# Patient Record
Sex: Female | Born: 1955 | Race: White | Hispanic: No | Marital: Married | Smoking: Former smoker
Health system: Southern US, Community
[De-identification: ages and names within clinical notes are randomized; demographics above are authoritative.]

## PROBLEM LIST (undated history)

## (undated) DIAGNOSIS — K635 Polyp of colon: Secondary | ICD-10-CM

## (undated) DIAGNOSIS — E049 Nontoxic goiter, unspecified: Secondary | ICD-10-CM

## (undated) HISTORY — DX: Nontoxic goiter, unspecified: E04.9

## (undated) HISTORY — DX: Polyp of colon: K63.5

---

## 2002-11-30 DIAGNOSIS — K635 Polyp of colon: Secondary | ICD-10-CM

## 2002-11-30 HISTORY — DX: Polyp of colon: K63.5

## 2005-10-11 ENCOUNTER — Inpatient Hospital Stay (HOSPITAL_COMMUNITY): Admission: EM | Admit: 2005-10-11 | Discharge: 2005-10-17 | Payer: Self-pay | Admitting: Emergency Medicine

## 2005-10-11 ENCOUNTER — Ambulatory Visit: Payer: Self-pay | Admitting: Infectious Diseases

## 2005-10-14 ENCOUNTER — Encounter (INDEPENDENT_AMBULATORY_CARE_PROVIDER_SITE_OTHER): Payer: Self-pay | Admitting: *Deleted

## 2005-10-15 ENCOUNTER — Ambulatory Visit: Payer: Self-pay | Admitting: Internal Medicine

## 2005-10-21 ENCOUNTER — Ambulatory Visit: Payer: Self-pay | Admitting: Internal Medicine

## 2005-12-08 ENCOUNTER — Ambulatory Visit (HOSPITAL_COMMUNITY): Admission: RE | Admit: 2005-12-08 | Discharge: 2005-12-08 | Payer: Self-pay | Admitting: Gastroenterology

## 2006-10-13 ENCOUNTER — Ambulatory Visit: Payer: Self-pay | Admitting: Unknown Physician Specialty

## 2007-02-24 ENCOUNTER — Observation Stay (HOSPITAL_COMMUNITY): Admission: AD | Admit: 2007-02-24 | Discharge: 2007-02-25 | Payer: Self-pay | Admitting: Gastroenterology

## 2008-06-29 ENCOUNTER — Encounter: Admission: RE | Admit: 2008-06-29 | Discharge: 2008-06-29 | Payer: Self-pay | Admitting: Gastroenterology

## 2011-04-17 NOTE — Op Note (Signed)
[name], [name]                 ACCOUNT NO.:  [id number]   MEDICAL RECORD NO.:  [id number]          PATIENT TYPE:  INP   LOCATION:  6742                         FACILITY:  [facility]   PHYSICIAN:  Clovis Pu. Cornett, M.D.DATE OF BIRTH:  [birth year]   DATE OF PROCEDURE:  10/14/2005  DATE OF DISCHARGE:                                 OPERATIVE REPORT   PREOPERATIVE DIAGNOSIS:  Right lower quadrant pain.   POSTOP DIAGNOSES:  1.  A significant right retroperitoneal inflammatory change.  2.  Normal-appearing appendix.  3.  Normal-appearing gallbladder with distension, small bowel, sigmoid      colon, transverse, ascending colon appeared normal with normal-appearing      stomach and liver. Normal appearing right ovary with fibroids and      uterus, left ovary not visualized.   PROCEDURE:  1.  Diagnostic laparoscopy.  2.  Incidental appendectomy.   SURGEON:  Dr. Harriette Bouillon.   ANESTHESIA:  General endotracheal anesthesia with 10 mL of 0.25% Sensorcaine  with epinephrine.   ESTIMATED BLOOD LOSS:  10 mL.   ANESTHESIA:  General endotracheal anesthesia.   INDICATIONS FOR PROCEDURE:  The patient is a 55 year old female who recently  has been treated for pyelonephritis. She was readmitted due to sudden right  mid and right lower quadrant abdominal pain. I was asked to see the patient  for evaluation and she had tenderness in her right mid to right lower  quadrant on evaluation. She did not have any significant guarding or rebound  but I was concerned that she may have appendicitis. Of note, she has been  recently treated for pyelonephritis but her urine had cleared and concern  was for appendicitis at this point. After discussion of options, I  recommended a diagnostic laparoscopy since her previous CT scan did not  reveal any evidence of appendicitis nor any abscess per se. There was some  fluid in her gallbladder on CT as well so I wanted to look at her  gallbladder at the same time  to make sure she did not have acute  cholecystitis. She had no evidence of stones by CT.  Also her symptoms did  not appear to be colic in nature.   DESCRIPTION OF PROCEDURE:  The patient was brought to the operating room and  placed supine. After induction of general endotracheal anesthesia, her  abdomen prepped and draped in sterile fashion. A 1 cm supraumbilical  incision was made. Dissection was carried to her fascia and her fascia was  opened with a scalpel blade. I placed a Kelly clamp into the peritoneal  cavity without difficulty.  0 Vicryl pursestring suture was placed in the  fascia and a 12 mm Hasson cannula was placed in direct vision.  Pneumoperitoneum was created to 15 mm millimeter mercury CO2 and laparoscope  was placed. She was then placed in steep Trendelenburg. Next, I placed two 5  mm ports, one in the midline between the pubic symphysis and umbilicus under  direct vision. A second port was placed in left mid abdomen.  Upon  inspection there was some clear fluid  in her pelvis but no signs of pus.  This was not bile-stained. I went ahead and examined her uterus, which was  quite small and fibrotic which appeared to be secondary to fibroids. I was  able to visualize her right ovary and fallopian tube and this appeared  normal. I was unable to visualize the left ovary due to the sigmoid colon  and its attachments. There is no obvious inflammatory change in the pelvis  that I could see. I then rolled the patient over to her left. The appendix  was visualized and photographed. This appeared grossly normal. Of note,  there was some fullness I was palpating with a grasper which could be a  possible appendicolith that no inflammatory change was noted.  I examined  her gallbladder which appeared injected but the gallbladder wall itself was  robin egg blue, was not thickened. Of note, there was retroperitoneal  inflammatory change noted in the right upper quadrant when I pulled  the  gallbladder up which would be more consistent with her history of  pyelonephritis. There was not an obvious abscess that I feel this  inflammatory change is what made the gallbladder appear injected. There was  no thickening of the gallbladder wall and the thickening noted on CT was  more likely secondary to intraperitoneal fluid.  Her ascending, transverse  and descending and sigmoid colon appeared grossly normal.  Her small bowel  appeared grossly normal. Liver, stomach appeared normal. Did not visualize  her spleen but it was not enlarged, but the area was not prominent. This  point, after examining, after reviewing the patient's CT and history in my  head, I felt that incidental appendectomy would be appropriate since she is  tender over this area and this would help to not cloud this issue. Using the  harmonic scalpel, I took down the mesoappendix under direct vision until I  encountered the base of the appendix. Once I did this, I fired a Health visitor with a blue cartridge across the base of the appendix and then  removed the appendix with an EndoCatch bag. Once this was done, I reexamined  the base and I used electrocautery to stop a little bit of oozing from the  staple line. I also used a second application of the harmonic scalpel to the  mesoappendix for good hemostasis as well there. There are no signs of  bleeding after irrigation. This area was irrigated and this was suctioned  out.  Next, I placed the patient in reverse Trendelenburg. I placed a 5 mm  port in the subxiphoid position and used a grasper to elevate up the  gallbladder. It was tense, but the gallbladder itself  did not show any  signs of acute cholecystitis. Of note again was the inflammatory stranding  in the retroperitoneum in the right upper quadrant which you can see  involving the right colon at the hepatic flexure but the colon itself appeared uninvolved.  I feel this is secondary to her history of   pyelonephritis and this was visualized on her CT scan as well. This more  than likely would explain her pain at this point.  At this point, I did not  elect to remove the gallbladder at this point since there is no obvious  signs of acute inflammatory change and felt this could be explained by the  retroperitoneal stranding that I saw today. At this point, I removed her  ports and direct vision and released the CO2  without any signs of port site  bleeding. There is no evidence of bowel injury prior to removing the ports.  Once I did this, the camera was withdrawn and the umbilical port was  removed. CO2 was released. I then closed the fascia at the umbilicus with a  0 Vicryl.  4-0 Monocryl was used to close all skin incisions. All sponge,  needle and instrument counts were counted and found to be correct at this  portion of the case. All sponge, needle, instrument counts counted and found  to be correct this portion of the case. The patient was awoke, taken to  recovery in satisfactory condition.      Thomas A. Cornett, M.D.  Electronically Signed     TAC/MEDQ  D:  10/14/2005  T:  10/14/2005  Job:  [id number]   cc:   Fransisco Hertz, M.D.  Fax: [phone]

## 2011-04-17 NOTE — Op Note (Signed)
[name], HEIDEMAN                 ACCOUNT NO.:  [id number]   MEDICAL RECORD NO.:  [id number]          PATIENT TYPE:  AMB   LOCATION:  ENDO                         FACILITY:  [facility]   PHYSICIAN:  James L. Malon Kindle., M.D.DATE OF BIRTH:  20-May-1956   DATE OF PROCEDURE:  12/08/2005  DATE OF DISCHARGE:                                 OPERATIVE REPORT   PROCEDURE PERFORMED:  Esophagogastroduodenoscopy.   MEDICATIONS:  Fentanyl 75 mcg, Versed 7 mg IV.   SCOPE:  Olympus adult endoscope.   ENDOSCOPIST:  Llana Aliment. Randa Evens, M.D.   INDICATIONS FOR PROCEDURE:  Interesting woman who was hospitalized in  November with right-sided abdominal pain.  CT scan showed what appeared to  be peritonitis in the right upper quadrant.  The patient was treated with  antibiotics, got better, had a laparoscopy that showed a normal gallbladder  with no obvious cause of the peritonitis.  __________ was taken and was  normal.  She has been treated with Avelox for a month.  This procedure is  done to look for some sort of cause such as a chronic ulceration, mass,  etc., that may explain the peritonitis.   DESCRIPTION OF PROCEDURE:  The procedure had been explained to the patient  and consent obtained.  With the patient in left lateral decubitus position,  the Olympus scope was inserted and advanced.  The esophagus was completely  endoscopically normal, no ulcerations __________, no varices.  The stomach  was entered.  The pylorus identified and passed.  The duodenum including the  bulb and second portion were seen quite well and were completely  endoscopically normal.  No ulcerations, no diverticula.  Top of the ampulla  appeared normal.  No masses seen.  There were no sign of chronic ulcer  disease such as scarring, distorted bulb, duodenal bulb was widely patent  when insufflated with air.  The stomach was examined and was completely  normal on forward and retroflex view.  The scope was withdrawn.  The  patient  tolerated the procedure well.   ASSESSMENT:  1.  Abnormal CT scan, peritonitis demonstrated on CT and laparoscopy without      obvious cause.   PLAN:  Will follow the patient back up in three to four months.  Will have  her follow with her doctor, __________.           ______________________________  Llana Aliment. Malon Kindle., M.D.     Waldron Session  D:  12/08/2005  T:  12/08/2005  Job:  [id number]   cc:   Julieanne Manson  Fax: [phone]   Maisie Fus A. Cornett, M.D.  [address]  [place] [address]   Fransisco Hertz, M.D.  Fax: [phone]

## 2011-04-17 NOTE — H&P (Signed)
NAMECATELIN, [name]                 ACCOUNT NO.:  [id number]   MEDICAL RECORD NO.:  [id number]          PATIENT TYPE:  INP   LOCATION:  5705                         FACILITY:  [facility]   PHYSICIAN:  James L. Malon Kindle., M.D.DATE OF BIRTH:  [birth year]   DATE OF ADMISSION:  02/24/2007  DATE OF DISCHARGE:                              HISTORY & PHYSICAL   REASON FOR ADMISSION:  Abdominal pain.   HISTORY:  The patient is a nice, 55 year old, white female who comes in  today with acute abdominal pain.  She had an interesting story in that  she was admitted to the hospital in 2006.  At that time, she had severe  right-sided abdominal pain and ended up going to the emergency room at  [facility].  She had been treated for dysuria with antibiotics  at that time and the feeling was that she might have had pyelonephritis.  The patient had an extensive workup then, was doing given IV Zosyn, and  got better.  She never apparently got well and a diagnostic laparoscopy  was performed, which demonstrated significant retroperitoneal  inflammation.  The cecum and terminal ileum were normal.  The  gallbladder was normal.  The patient did have an appendectomy at that  time by Dr. Luisa Hart.  A small bowel series did not show any acute  abnormalities.  The cause of her acute retroperitoneal inflammation was  unclear and she was treated with antibiotics.  Again, she did have  marked improvement and was discharged home on a month of antibiotics  with followup by ID.  She got better.  We saw her and the issue of some  sort of problem with the duodenum such as duodenal diverticulitis or  something of this nature had been brought up.  It was recommended that  she have an endoscopy which was performed in January 2007, and it was  completely normal.  The patient has done well since then.  She receives  her medical care in [place].  We have not seen her back since then .  She notes that since 2006, she  has been completely asymptomatic and has  not had any problems.  She sees Dr. Julieanne Manson there, has seen Dr.  Mechele Collin, has had previous colon polyps removed.  It is notable that she  had no symptoms at all since 2006.  She had a colonoscopy done as  routine followup in November 2007, that was noted to be normal.  She was  told there were no abnormalities.  She again started having dysuria, saw  her urologist in [place], exactly as she had the last time this  happened.  She had some blood in the urine and apparently was given  antibiotics with 4 tablets.  This was about 2 weeks ago.  She took 4  tablets of Levaquin, and her symptoms resolved completely.  Her dysuria  and blood in the urine went away.  She had a urine culture done but the  urine culture returned negative.  She then had an IVP done on March  20th, which she was  called and told it was negative by her urologist.  She has been totally asymptomatic for week.  No dysuria, abdominal pain.  This morning she woke up with abdominal pain which started on the right  side.  It has become more diffuse, and now it is periumbilical.  It has  her doubled over.  She initially ate but this made things worse, so she  has not had anything to eat for a while.  She has not had a bowel  movement today.  No diarrhea associated with this.  She has had some  chills.  Pain has gotten progressively worse according to her husband  over the course of the day with the patient getting to the point where  she could hardly stand up and she came back as an urgent work-in this  afternoon.   CURRENT MEDICATIONS:  1. Cozaar 50 mg daily.  2. Desipramine 25 mg q.h.s.  3. Coreg 3.125 b.i.d.  4. Levothroid 0.100, alternating with 0.112 mg daily.  5. Protonix 40 mg daily.  6. Various vitamins, specifically C, zinc, multivitamin, and Tums      daily.   ALLERGIES:  1. SULFA.  2. CODEINE.  3. MORPHINE CAUSES HALLUCINATIONS.   MEDICAL HISTORY:  1.  History of kidney stones.  2. Urinary tract infections.  3. History of mitral valve prolapse with chest pain, has been on a      beta-blocker for some time.  4. History of hypertension.  5. Chronic anxiety disorder.  6. Hypothyroidism.  7. History of colon polyps with a recent colonoscopy negative in      November.   SURGERIES:  1. Appendectomy and diagnostic laparoscopy by Dr. Luisa Hart in November      2006.  2. Tonsillectomy in the 1970s.   FAMILY HISTORY:  Remarkable for colon cancer in her mother, skin cancer  in father, two children are healthy.   SOCIAL HISTORY:  She is married, is a homemaker, does not smoke, drinks  socially.   REVIEW OF SYSTEMS:  Primarily as noted above.  She has had no signs of  rectal bleeding, melena, tarry stools, etc.   PHYSICAL EXAMINATION:  VITAL SIGNS:  Temperature 97.1, pulse 100, blood  pressure 130/90, weight 141.  GENERAL:  A pleasant white female who was doubled over the exam table,  cannot straighten up.  EYES:  Sclerae nonicteric.  Extraocular movements intact.  NECK:  Supple.  No lymphadenopathy.  No masses.  ABDOMEN:  Diffusely tender.  There is some guarding but no rebound  tenderness.  This is diffuse, but moderate.  I do not appreciate  localized rigidity.  RECTAL:  Stool is brown, heme-negative.   ASSESSMENT:  1. Severe abdominal pain of unclear cause.  This woman has had a      history in the past of peritonitis with the cause undetermined.  It      began just like this with what was felt to be a kidney infection,      but actually turned out to be retroperitoneal peritonitis of      unknown cause.  This presentation seems to be very similar.  And I      think admitting her to the hospital for pain control and a CT scan      to rule out further peritonitis would be appropriate.  I have      discussed this with she and her husband.  They are agreeable.   PLAN: 1. We will admit to the  hospital.  We will keep her on all of her  oral      medications at this point with a sip of water.  2. We will give her Dilaudid for pain since she is intolerant of      morphine.  3. We will obtain routine labs as well as a CT of the abdomen and      pelvis with oral and IV contrast.           ______________________________  Llana Aliment. Malon Kindle., M.D.     Waldron Session  D:  02/24/2007  T:  02/24/2007  Job:  [id number]   cc:   Stephens Shire. Cornett, M.D.  Fransisco Hertz, M.D.

## 2011-04-17 NOTE — Discharge Summary (Signed)
[name], [name]                 ACCOUNT NO.:  [id number]   MEDICAL RECORD NO.:  [id number]          PATIENT TYPE:  INP   LOCATION:  5507                         FACILITY:  [facility]   PHYSICIAN:  Shirley Friar, MDDATE OF BIRTH:  1956/04/27   DATE OF ADMISSION:  02/24/2007  DATE OF DISCHARGE:                               DISCHARGE SUMMARY   DISCHARGE DIAGNOSES:  1. Abdominal pain.  2. History of peritonitis in 2006.  3. History of kidney stones.  4. History of mitral valve prolapse.  5. Hypertension.  6. History of chronic anxiety.  7. Hypothyroidism.  8. History of colon polyps with a recent colonoscopy negative in      November.  9. Status post appendectomy and diagnostic laparoscopy in 2006.  10.Status post tonsillectomy.   DISCHARGE MEDICINES:  1. Phenergan 25 mg p.o. b.i.d. as needed.  2. Cozaar 50 mg daily.  3. Desipramine 25 mg q.h.s.  4. Coreg 3.125 mg b.i.d.  5. Levothroid 0.1 alternating with 0.112 mg daily.  6. Protonix 40 mg p.o. daily.   HOSPITAL COURSE:  [name] was seen in the office by Dr. Randa Evens on  February 24, 2007, and due to severe acute abdominal pain for 24 hours in  the setting of her history of retroperitoneal inflammation 2 years ago,  the decision was made to do inpatient evaluation with pain control.  She  was admitted and given a dose of Dilaudid IV, which took away her pain  completely and it did not return.  Her labs on admission revealed a  slight anemia, at 11 hemoglobin.  She had normal liver function tests  and normal amylase, lipase.  Her urinalysis was negative.  A CAT scan  was done on admission which revealed a negative abdominal CT.  She had  two small uterine fibroids measuring, the largest measuring 2.6-cm.  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  Abdominal ultrasound was done which was negative.  Due to  complete resolution of her pain and tolerating a regular diet on February 25, 2007, and a negative CT and ultrasound, the decision was made to  discharge her home on Phenergan.  She also requested to have twice a day  Protonix to use as needed due to history of reflux.   DISCHARGE CONDITION:  Improved.   DISCHARGE DISPOSITION:  To home, stable.   DISCHARGE FOLLOWUP:  1. Follow with Dr. Randa Evens at [facility] in 3-4 weeks.  2. Follow with her regular doctor in 2 weeks.  3. I encouraged her to set up a followup with her gynecologist either      through her regular doctor who she says is family medicine or      through a gynecologist to further evaluate her fibroids and pelvic      varices, although I think that is unrelated to her history of      abdominal pain.  She plans to set that evaluation or her own.      Shirley Friar, MD  Electronically Signed  VCS/MEDQ  D:  02/25/2007  T:  02/25/2007  Job:  [id number]   cc:   Fayrene Fearing L. Malon Kindle., M.D.  Richard Pryor Curia A. Cornett, M.D.

## 2011-04-17 NOTE — Discharge Summary (Signed)
NAMERONNAE, KASER                 ACCOUNT NO.:  [id number]   MEDICAL RECORD NO.:  [id number]          PATIENT TYPE:  INP   LOCATION:  6742                         FACILITY:  [facility]   PHYSICIAN:  C. Ulyess Mort, M.D.DATE OF BIRTH:  1956-05-08   DATE OF ADMISSION:  10/11/2005  DATE OF DISCHARGE:  10/17/2005                                 DISCHARGE SUMMARY   DISCHARGE DIAGNOSES:  1.  Abdominal pain secondary to resumed duodenal diverticulitis with      retroperitoneal inflammation.  2.  Hypertension.  3.  Hypothyroidism.  4.  Anxiety disorder, not otherwise specified.  5.  Status post appendectomy, October 14, 2005.   DISCHARGE MEDICATIONS:  1.  Avelox 400 mg one tablet daily for four weeks.  2.  Protonix 40 mg one tablet daily.  3.  Reglan 10 mg one tablet q.6 h. p.r.n. for nausea.  4.  Cozaar 50 mg one tablet daily for blood pressure.  5.  Aspirin 81 mg daily.  6.  Carvedilol 6.25 mg b.i.d.  7.  Synthroid 25 mcg one tablet daily.  8.  Desipramine 25 mg one tablet q.h.s.   CONDITION ON DISCHARGE:  Stable.   CONSULTATIONS:  1.  Gastroenterology.  2.  Surgery.   FOLLOW UP:  1.  The patient will have a follow-up appointment on October 21, 2005 in      the outpatient clinic setting.  2.  The patient was advised to contact Dr. Juanda Chance, gastroenterologist, for      appropriate follow-up appointment.  3.  The patient will follow up with Dr. Luisa Hart, general surgery, in two      weeks from this date of discharge.   PROCEDURES:  1.  Diagnostic laparoscopy and incidental appendectomy.  Date of procedure:      October 14, 2005.  2.  Small bowel follow-through.  Date of procedure:  October 17, 2005.   HISTORY OF PRESENT ILLNESS:  Ms. Orlowski is a 55 year old Caucasian woman who  presented with acute onset of severe right upper quadrant abdominal pain  late Saturday night and arrived at the emergency room early Sunday morning.  The patient stated that the two weeks prior she  had had a history of dysuria  and frequency and had received two courses of Augmentin with resolution of  her symptoms.  The patient stated that her pain was very severe in the right  upper quadrant, as well as right flank area, and she had felt feverish with  some chills.   PHYSICAL EXAMINATION:  VITAL SIGNS:  Temperature 102.3 with a pulse of 114.  Blood pressure was 160/94.  O2 saturation was 99-93% on room air.  GENERAL:  The patient was in significant discomfort and bending over in  significant pain.  Exam was pertinent for tenderness throughout the right  upper quadrant, particularly in the right flank region, but otherwise the  patient's skin was warm and dry.   LABORATORY DATA:  Hemoglobin 12.5, hematocrit 37.6, white blood cell count  9.9, platelets 266.  Sodium 137, potassium 3.7, BUN 14, creatinine 0.6.  Urine pregnancy test  was negative.  Urinalysis showed specific gravity of  1.015.  ALT was 19, AST 21, alkaline phosphatase 48, total bilirubin 0.8.   Imaging:  CT of the abdomen and pelvis on the day of admission showed no  stones or hydronephrosis; however, it indicated a possible right  pyelonephritis with mesenteric stippling.   HOSPITAL COURSE:  1.  ACUTE RIGHT ABDOMINAL PAIN.  Although the patient's admission CT did      indicate evidence of acute pyelonephritis, urinalysis in the emergency      room as well as a repeat urinalysis showed no evidence of infection.      There was negative nitrates or leukocyte esterase.  Unfortunately a      pelvic exam was not performed in the emergency room; however, a bimanual      exam was performed on November 14, which showed no cervical motion      tenderness or other abnormality.  Rectal exam also was unremarkable for      any abnormality with normal rectal tone.  Because urinalyses as well as      cultures were unremarkable, the patient's pain persisted as well as      elevated temperature throughout this admission despite  treatment with IV      Zosyn.  As such, a surgery consult was placed and the patient was seen      on October 13, 2005.  Because of the significant tenderness there was a      question whether or not the patient had an acute abdomen and because of      the normal white blood cell count, as well as temperature spikes, she      was taken for diagnostic laparoscopy to rule out any acute pathology.      This was performed on November 15 and the patient was told that      laparoscopy demonstrated significant retroperitoneal inflammation.  The      terminal ileum and cecum were noted to be normal, but the patient had an      incidental appendectomy.  Throughout the course a repeat CT of the      abdomen and pelvis with contrast was obtained on October 12, 2005 and      reevaluated, given the patient's persistent complaints.  Review of the      abdomen with the radiologist did demonstrate a possible diverticular      lesion in the duodenum.  Repeat abdominal x-ray on November 17 did show      findings suspicious for duodenal perforation.  This __________film was      obtained because of the small bowel follow-through and upper GI series      which were to be performed the following day.  Recommendations were made      for possible EGD; however, surgery indicated that acute present stage      EGD would not be indicated because of the severe nature for possible      perforation and/or worsening of her condition.  As such a small bowel      follow-through was obtained on November 18 which did not show any acute      abnormalities.  However, by this time, the patient's pain had gradually      subsided and this after several days of IV Zosyn therapy.  Because of      the degree of inflammation within the retroperitoneum, it was suggested      that there might  have possibly been a small leak to this diverticulum      within the duodenum and thus the inflammation within the peritoneum.     However, the  patient was advised that should her pain recur, that she      was to follow back up immediately.  More than likely she will need a      follow-up CT in 3-4 weeks to evaluate for any evidence of an abscess, as      well as a follow-up appointment in gastroenterology within approximately      three weeks as well.  She was advised to continue her Protonix as well      as p.r.n. Reglan therapy.  The patient was also given a prescription for      Avelox after discussion with Dr. Maurice March regarding appropriate coverage.      This was to be continued for approximately four weeks.  As such the      patient was discharged in stable condition with no further evidence of      abdominal pain.   1.  HYPOTHYROIDISM.  The patient's repeat TSH and T4 were unremarkable and      she was continued on her standard dose of Synthroid.   1.  HYPERTENSION.  The patient was continued on her Cozaar and Carvedilol      therapy and throughout the course of her admission, was not adjusted      because the patient was under a significant amount of pain and      discomfort.  However, these medications were continued and she responded      appropriately.   1.  NORMOCYTIC ANEMIA.  During this hospital admission the patient's      hemoglobin and hematocrit were monitored closely and there was a slight      decrease to her hemoglobin and hematocrit from admission to the time of      her discharge.  She started with a hemoglobin of 12.5, hematocrit 36.6,      with an MCV of 87, and this had dropped to approximately 9.6 and 27.2,      hemoglobin and hematocrit respectively at the time of discharge.  This      was felt to be related to her acute process and did not feel like this      was significant.  In addition the patient did not have any acute      bleeding and as Hemoccult testing was negative, felt that this was      strictly related to her acute process.   The patient was discharged in stable condition with follow-up  instructions  as dictated previously.      Coralie Carpen, M.D.    ______________________________  C. Ulyess Mort, M.D.    FR/MEDQ  D:  10/17/2005  T:  10/18/2005  Job:  [id number]   cc:   C. Ulyess Mort, M.D.  Fax: [phone]   Clovis Pu. Cornett, M.D.  [address]  [place] [address]   Lina Sar, M.D. [facility]  [address]  [place]  [place] [address]

## 2011-07-15 ENCOUNTER — Ambulatory Visit: Payer: Self-pay | Admitting: General Practice

## 2011-11-11 ENCOUNTER — Other Ambulatory Visit: Payer: Self-pay | Admitting: Gastroenterology

## 2011-11-11 DIAGNOSIS — R1011 Right upper quadrant pain: Secondary | ICD-10-CM

## 2011-11-13 ENCOUNTER — Ambulatory Visit
Admission: RE | Admit: 2011-11-13 | Discharge: 2011-11-13 | Disposition: A | Discharge status: Home / Self Care | Payer: BC Managed Care – PPO | Source: Ambulatory Visit | Attending: Gastroenterology | Admitting: Gastroenterology

## 2011-11-13 DIAGNOSIS — R1011 Right upper quadrant pain: Secondary | ICD-10-CM

## 2012-07-05 ENCOUNTER — Ambulatory Visit: Payer: Self-pay | Admitting: Podiatry

## 2013-04-26 ENCOUNTER — Telehealth: Payer: Self-pay | Admitting: *Deleted

## 2013-04-26 NOTE — Telephone Encounter (Signed)
Pt called stating that she needs to schedule a genetic appt due to a strong family hx of several types of cancer.  Confirmed 05/08/13 genetic appt w/ pt.  Called Dr. Chestine Spore Preston's office at [phone] and requested records.

## 2013-05-08 ENCOUNTER — Ambulatory Visit (HOSPITAL_BASED_OUTPATIENT_CLINIC_OR_DEPARTMENT_OTHER): Payer: BC Managed Care – PPO | Admitting: Genetic Counselor

## 2013-05-08 ENCOUNTER — Encounter: Payer: Self-pay | Admitting: Genetic Counselor

## 2013-05-08 ENCOUNTER — Other Ambulatory Visit: Payer: BC Managed Care – PPO | Admitting: Lab

## 2013-05-08 DIAGNOSIS — Z8052 Family history of malignant neoplasm of bladder: Secondary | ICD-10-CM

## 2013-05-08 DIAGNOSIS — Z803 Family history of malignant neoplasm of breast: Secondary | ICD-10-CM

## 2013-05-08 DIAGNOSIS — Z800 Family history of malignant neoplasm of digestive organs: Secondary | ICD-10-CM

## 2013-05-08 NOTE — Progress Notes (Signed)
Dr.  Margaretmary Bayley requested a consultation for genetic counseling and risk assessment for [name], a 57 y.o. female, for discussion of her family history of colon, bladder, pancreatic and breast cancer. She presents to clinic today to discuss the possibility of a genetic predisposition to cancer, and to further clarify her risks, as well as her family members' risks for cancer.   HISTORY OF PRESENT ILLNESS: [name] is a 57 y.o. female with no personal history of cancer.  She has had a benign breast biopsy in the past, and has been diagnosed with fibrocystic breast disease.  She has been treated for a goiter for about 15-20 years.  Her first colonoscopy was at 47 and 2 polyps were found.  She has had 1-2 colonoscopies since with no polyps.  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  Past Medical History  Diagnosis Date  . Colon polyps 2004  . Goiter     dx in her 30s    History reviewed. No pertinent past surgical history.  History  Substance Use Topics  . Smoking status: Former Smoker -- 0.50 packs/day for 12.5 years    Types: Cigarettes    Quit date: 05/08/1980  . Smokeless tobacco: Not on file  . Alcohol Use: Yes     Comment: occasional    REPRODUCTIVE HISTORY AND PERSONAL RISK ASSESSMENT FACTORS: Menarche was at age 56.   Menopause in her 30s- 40s Uterus Intact: Yes Ovaries Intact: Yes G2P2A0, first live birth at age 57  She has not previously undergone treatment for infertility.   Never exposed to DES   She is currently using HRT.    FAMILY HISTORY:  We obtained a detailed, 4-generation family history.  Significant diagnoses are listed below: Family History  Problem Relation Age of Onset  . Colon cancer Mother     dx in her 33s  . Kidney cancer Mother 53    slow growing  . Cancer Father     squamous cell cancer in the mouth; smoker and Journalist, newspaper - syphoned gasoline  . Bladder Cancer Brother  28    smoker for 40 years  . Breast cancer Maternal Aunt 49  . COPD Maternal Uncle   . Dementia Maternal Grandmother   . Heart disease Maternal Grandfather   . Pancreatic cancer Cousin 67    smoker  . Breast cancer Paternal Aunt     dx >59 years of age  The patient's only brother is a 43 year smoker and was diagnosed at age 40 with bladder cancer.  Her mother was diagnosed with colon cancer in her 10s, along with approximately 3 polyps at that time.  At 36 she had a CT scan and incidentally found a renal cell carcinoma, which the patient reports that has been present in previous CT scans, and therefore is slow growing.  Her mother had 11 siblings.  One had breast cancer at 32, and one sister had a son who was a long time smoker and developed pancreatic cancer at age 40.  There is no other reported cancer on her maternal side.  The patient's father was diagnosed with oral squamous cell carcinoma and died at age 27.  By report he was an Journalist, newspaper and used to syphon gas orally and was a long time smoker.  He had three sisters, one of whom had post menopausal breast cancer.  There is no other reported cancer on  her father side of the family.  Patient's maternal ancestors are of [place] descent, and paternal ancestors are of [place] descent. There is no reported Ashkenazi Jewish ancestry. There is no  known consanguinity.  GENETIC COUNSELING ASSESSMENT: [name] is a 57 y.o. female with a personal history of colon polyps and family history of breast, colon, pancreatic and bladder cancer. We, therefore, discussed and recommended the following at today's visit.   DISCUSSION: We reviewed the characteristics, features and inheritance patterns of hereditary cancer syndromes. Based on Kyle Martinezlopez's family history, she does not meet the medical criteria for doing genetic testing for hereditary colon cancer based on NCCN guidelines or BCBS policy.  Ms. Hohn has one maternal aunt who had breast  cancer at age 11, however, there are a lot of women on her maternal side with nobody else with breast or ovarian cancer.  Therefore the chance that there is a hereditary colon or breast cancer running in the family is low.  PLAN: After considering the risks, benefits, and limitations, Dalexa Gentz decided not to pursue genetic testing. Lydia Toren will receive a summary of her genetic counseling visit. This information will also be available in Epic. We encouraged Isys Tietje to remain in contact with cancer genetics annually so that we can continuously update the family history and inform her of any changes in cancer genetics and testing that may be of benefit for her family. Ritaj Iribe's questions were answered to her satisfaction today. Our contact information was provided should additional questions or concerns arise.  The patient was seen for a total of 50 minutes, greater than 50% of which was spent face-to-face counseling.  This plan is being carried out per Dr. Feliz Beam recommendations.  This note will also be sent to the referring provider via the electronic medical record. The patient will be supplied with a summary of this genetic counseling discussion as well as educational information on the discussed hereditary cancer syndromes following the conclusion of their visit.   Patient was discussed with Dr. Drue Second.   _______________________________________________________________________ For Office Staff:  Number of people involved in session: 2 Was an Intern/ student involved with case: no

## 2013-07-21 DIAGNOSIS — I341 Nonrheumatic mitral (valve) prolapse: Secondary | ICD-10-CM | POA: Insufficient documentation

## 2013-07-21 DIAGNOSIS — K631 Perforation of intestine (nontraumatic): Secondary | ICD-10-CM | POA: Insufficient documentation

## 2013-07-21 DIAGNOSIS — I340 Nonrheumatic mitral (valve) insufficiency: Secondary | ICD-10-CM | POA: Insufficient documentation

## 2013-07-21 DIAGNOSIS — I10 Essential (primary) hypertension: Secondary | ICD-10-CM | POA: Insufficient documentation

## 2013-07-21 DIAGNOSIS — E039 Hypothyroidism, unspecified: Secondary | ICD-10-CM | POA: Insufficient documentation

## 2013-08-04 ENCOUNTER — Encounter: Payer: Self-pay | Admitting: Internal Medicine

## 2014-02-06 ENCOUNTER — Ambulatory Visit: Payer: Self-pay | Admitting: Podiatry

## 2015-05-21 ENCOUNTER — Other Ambulatory Visit: Payer: Self-pay | Admitting: Family Medicine

## 2015-05-21 ENCOUNTER — Ambulatory Visit
Admission: RE | Admit: 2015-05-21 | Discharge: 2015-05-21 | Disposition: A | Discharge status: Home / Self Care | Payer: BLUE CROSS/BLUE SHIELD | Source: Ambulatory Visit | Attending: Family Medicine | Admitting: Family Medicine

## 2015-05-21 DIAGNOSIS — R102 Pelvic and perineal pain: Secondary | ICD-10-CM

## 2015-05-21 DIAGNOSIS — D259 Leiomyoma of uterus, unspecified: Secondary | ICD-10-CM | POA: Insufficient documentation

## 2015-05-21 DIAGNOSIS — I878 Other specified disorders of veins: Secondary | ICD-10-CM | POA: Diagnosis not present

## 2015-10-29 ENCOUNTER — Other Ambulatory Visit: Payer: Self-pay | Admitting: Gastroenterology

## 2015-12-21 IMAGING — US US TRANSVAGINAL NON-OB
1 series · 13 of 25 positions shown · non-contrast
Comparison: CT Abdomen and Pelvis 06/29/2008.

CLINICAL DATA: 59-year-old female with right lower quadrant and
pelvic pain for 2 days. No vaginal bleeding. Initial encounter.



[Series 1: us transvaginal non-ob · 0.20mm/px · 13 of 90 slices shown]
[im 1/90]
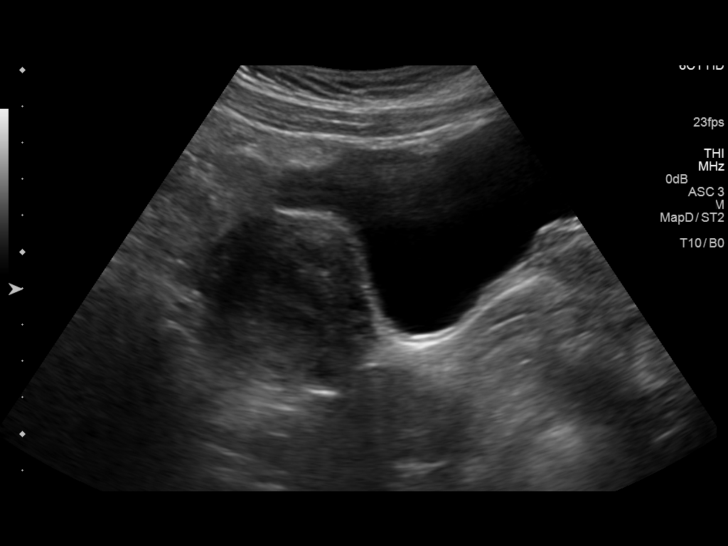
[im 8/90]
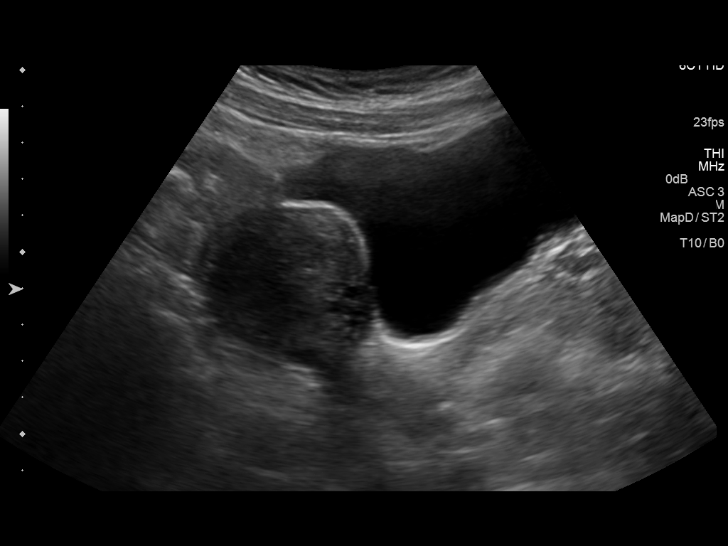
[im 15/90]
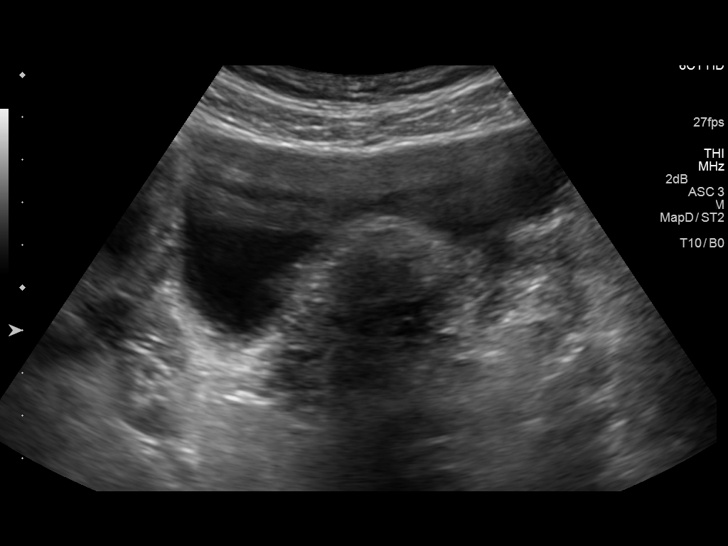
[im 23/90]
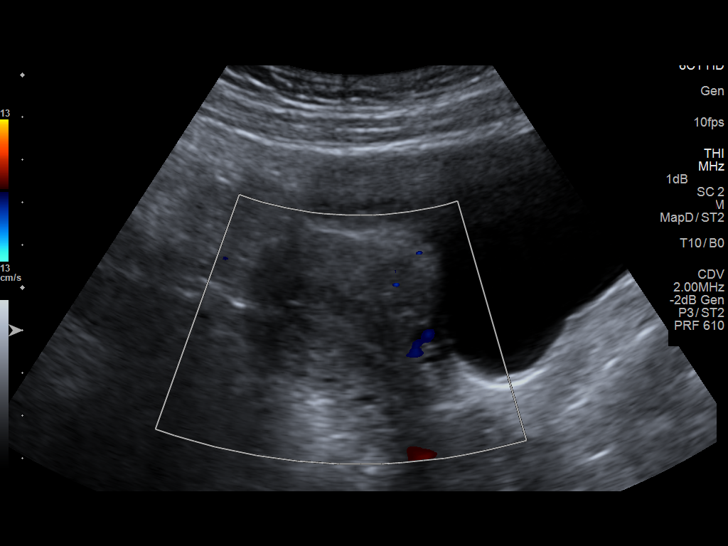
[im 30/90]
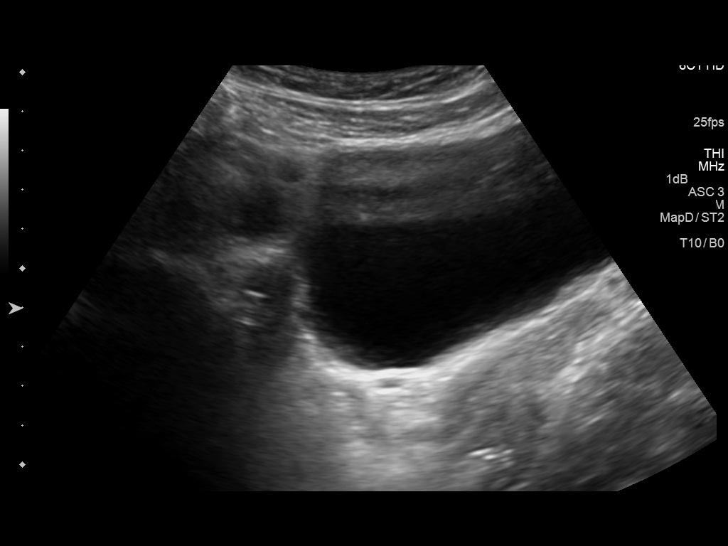
[im 38/90]
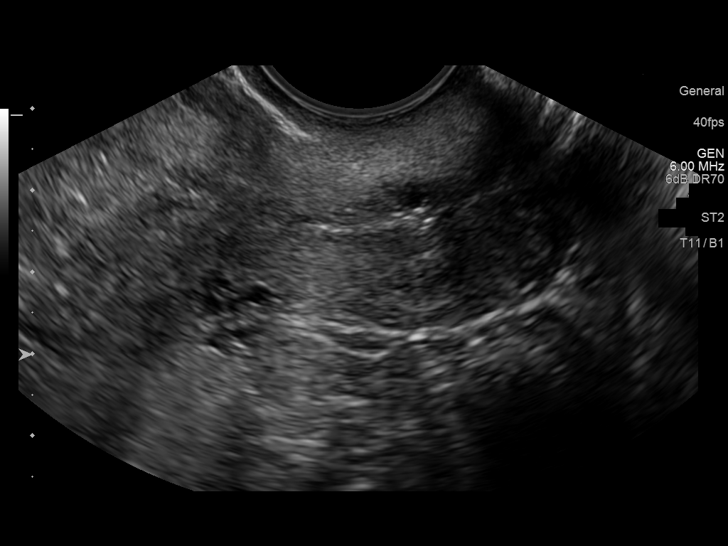
[im 45/90]
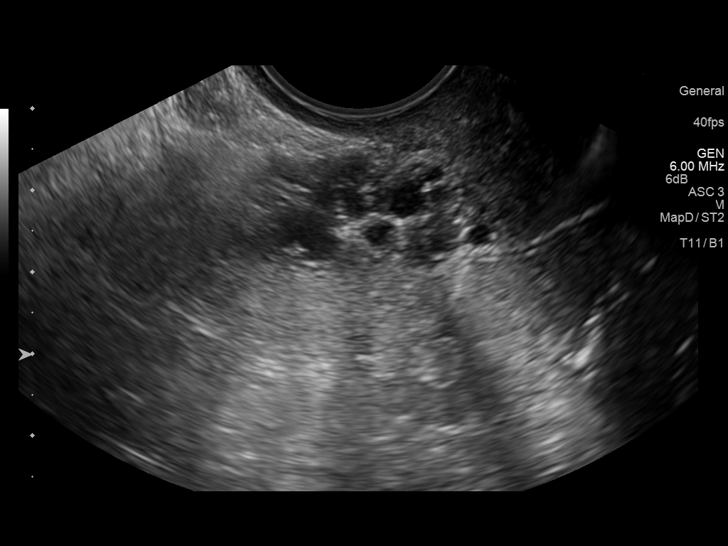
[im 52/90]
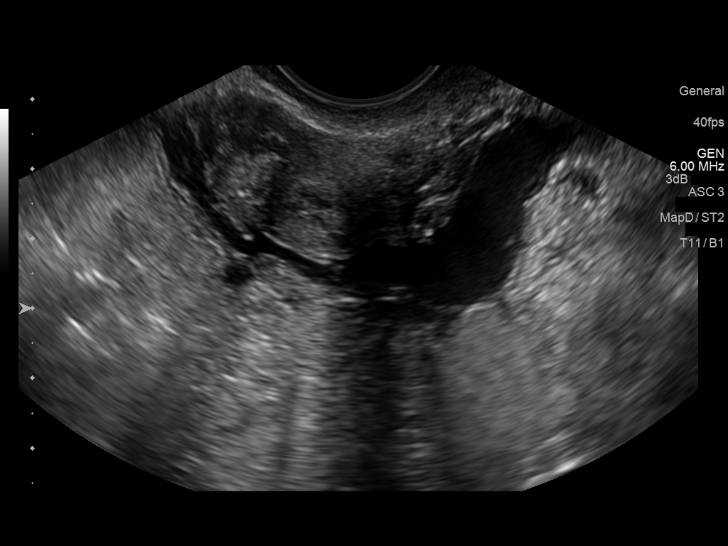
[im 60/90]
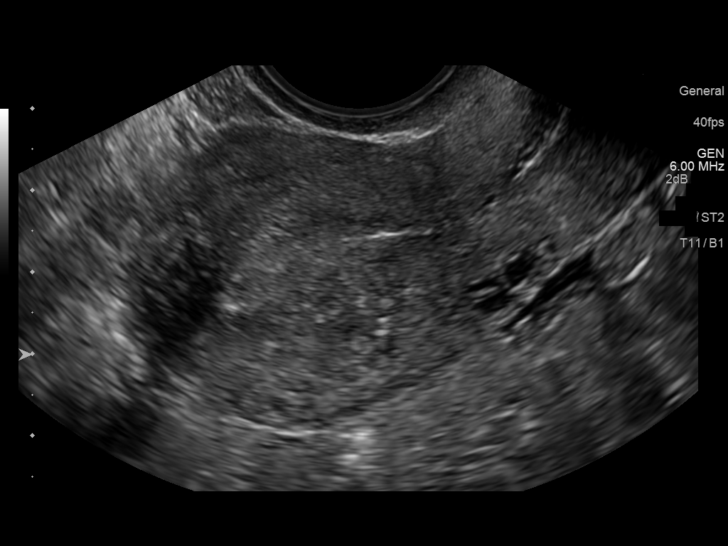
[im 67/90]
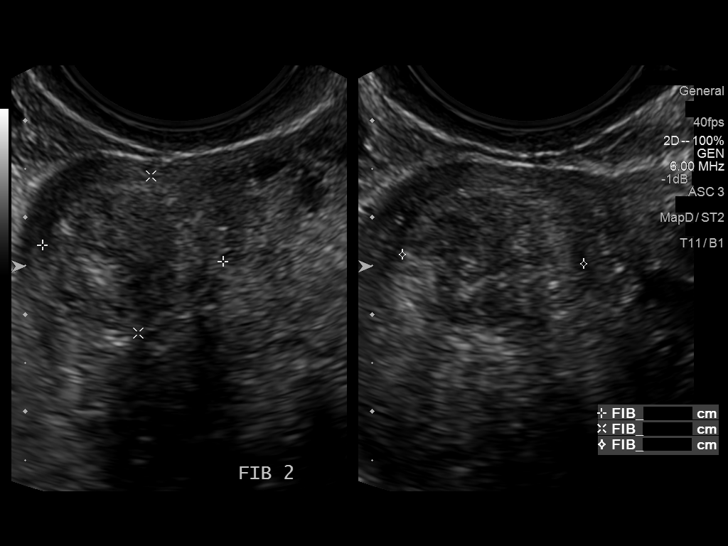
[im 75/90]
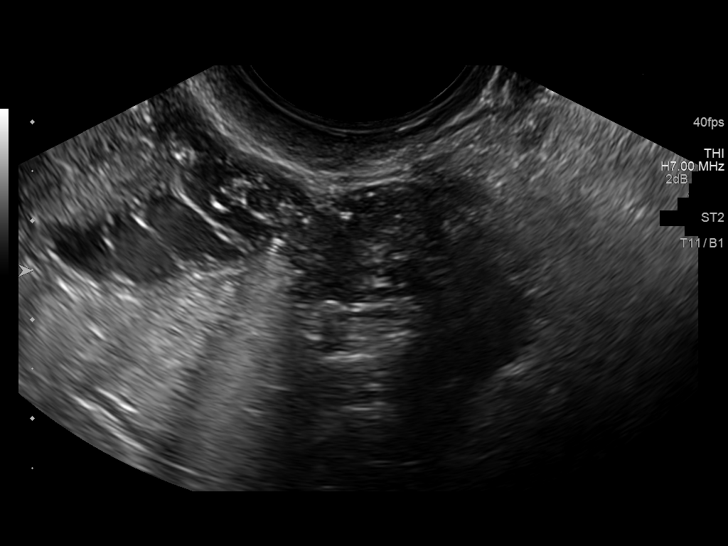
[im 82/90]
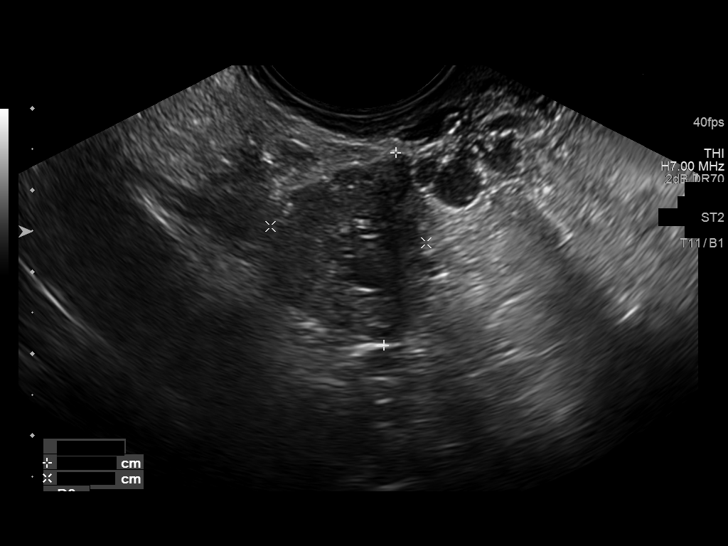
[im 90/90]
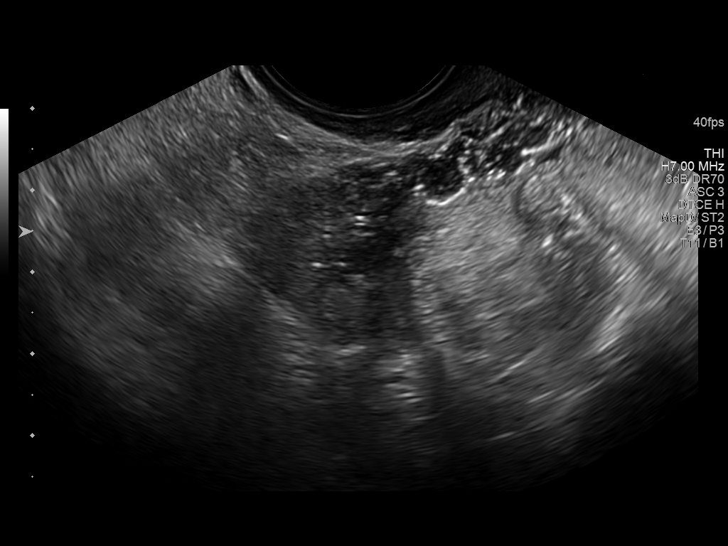

[13 of 25 positions shown; findings below may reference images not displayed]

FINDINGS: Uterus

Measurements: 8.6 x 6.4 x 4.7 cm. Chronic intramural fibroids, the
largest is 3.2 cm diameter at the posterior fundus (approximately
2.7 cm in 5555). There is a 1.9 cm fibroid at the anterior fundus.

Endometrium

Thickness: 8 mm.  No focal abnormality visualized.

Right ovary

Measurements: 2.4 x 1.9 x 2.0 cm. Normal appearance/no adnexal mass.

Left ovary

Measurements: 2.4 x 1.5 x 1.8 cm. Normal appearance/no adnexal mass.

Other findings

No pelvic free fluid identified. Chronic enlargement of distal left
gonadal/ parametrial veins (image 54 today, series 3, image 74 on
the prior study).
IMPRESSION: 1. Chronic fibroid uterus, mild progression since 5555 with the
largest fibroid now 3.2 cm.
2. Chronic enlarged pelvic/distal gonadal veins on the left.
Consider pelvic congestion syndrome.
These results will be called to the ordering clinician or
representative by the [HOSPITAL] at the imaging location.

## 2017-07-22 DIAGNOSIS — G4700 Insomnia, unspecified: Secondary | ICD-10-CM | POA: Insufficient documentation

## 2018-08-23 DIAGNOSIS — R911 Solitary pulmonary nodule: Secondary | ICD-10-CM | POA: Insufficient documentation

## 2021-03-08 DIAGNOSIS — K5100 Ulcerative (chronic) pancolitis without complications: Secondary | ICD-10-CM | POA: Insufficient documentation

## 2021-03-31 DIAGNOSIS — Z85528 Personal history of other malignant neoplasm of kidney: Secondary | ICD-10-CM | POA: Insufficient documentation

## 2021-07-31 ENCOUNTER — Ambulatory Visit (INDEPENDENT_AMBULATORY_CARE_PROVIDER_SITE_OTHER): Payer: Medicare HMO | Admitting: Dermatology

## 2021-07-31 ENCOUNTER — Other Ambulatory Visit: Payer: Self-pay

## 2021-07-31 DIAGNOSIS — L821 Other seborrheic keratosis: Secondary | ICD-10-CM

## 2021-07-31 DIAGNOSIS — L650 Telogen effluvium: Secondary | ICD-10-CM

## 2021-07-31 NOTE — Patient Instructions (Addendum)
If you have any questions or concerns for your doctor, please call our main line at [phone] and press option 4 to reach your doctor's medical assistant. If no one answers, please leave a voicemail as directed and we will return your call as soon as possible. Messages left after 4 pm will be answered the following business day.   You may also send [place] a message via [place]. We typically respond to MyChart messages within 1-2 business days.  For prescription refills, please ask your pharmacy to contact our office. Our fax number is [phone].  If you have an urgent issue when the clinic is closed that cannot wait until the next business day, you can page your doctor at the number below.    Please note that while we do our best to be available for urgent issues outside of office hours, we are not available 24/7.   If you have an urgent issue and are unable to reach [place], you may choose to seek medical care at your doctor's office, retail clinic, urgent care center, or emergency room.  If you have a medical emergency, please immediately call 911 or go to the emergency department.  Pager Numbers  - Dr. Nehemiah Massed: [phone]  - Dr. Laurence Ferrari: [phone]  - Dr. Nicole Kindred: [phone]  In the event of inclement weather, please call our main line at [phone] for an update on the status of any delays or closures.  Dermatology Medication Tips: Please keep the boxes that topical medications come in in order to help keep track of the instructions about where and how to use these. Pharmacies typically print the medication instructions only on the boxes and not directly on the medication tubes.   If your medication is too expensive, please contact our office at [phone] option 4 or send [place] a message through [place].   We are unable to tell what your co-pay for medications will be in advance as this is different depending on your insurance coverage. However, we may be able to find a substitute  medication at lower cost or fill out paperwork to get insurance to cover a needed medication.   If a prior authorization is required to get your medication covered by your insurance company, please allow [place] 1-2 business days to complete this process.  Drug prices often vary depending on where the prescription is filled and some pharmacies may offer cheaper prices.  The website www.goodrx.com contains coupons for medications through different pharmacies. The prices here do not account for what the cost may be with help from insurance (it may be cheaper with your insurance), but the website can give you the price if you did not use any insurance.  - You can print the associated coupon and take it with your prescription to the pharmacy.  - You may also stop by our office during regular business hours and pick up a GoodRx coupon card.  - If you need your prescription sent electronically to a different pharmacy, notify our office through [facility] or by phone at [phone] option 4.  Treatment for Telogen Effluvium Biotin 2.'5mg'$  daily Rogaine 5% 2 times a day Red light hair treatment, Hair Max, Capillus, Revian    Seborrheic Keratosis  What causes seborrheic keratoses? Seborrheic keratoses are harmless, common skin growths that first appear during adult life.  As time goes by, more growths appear.  Some people may develop a large number of them.  Seborrheic keratoses appear on both covered and uncovered body parts.  They  are not caused by sunlight.  The tendency to develop seborrheic keratoses can be inherited.  They vary in color from skin-colored to gray, brown, or even black.  They can be either smooth or have a rough, warty surface.   Seborrheic keratoses are superficial and look as if they were stuck on the skin.  Under the microscope this type of keratosis looks like layers upon layers of skin.  That is why at times the top layer may seem to fall off, but the rest of the growth  remains and re-grows.    Treatment Seborrheic keratoses do not need to be treated, but can easily be removed in the office.  Seborrheic keratoses often cause symptoms when they rub on clothing or jewelry.  Lesions can be in the way of shaving.  If they become inflamed, they can cause itching, soreness, or burning.  Removal of a seborrheic keratosis can be accomplished by freezing, burning, or surgery. If any spot bleeds, scabs, or grows rapidly, please return to have it checked, as these can be an indication of a skin cancer.

## 2021-07-31 NOTE — Progress Notes (Signed)
   New Patient Visit  Subjective  [name] is a 65 y.o. female who presents for the following: Alopecia (Scalp, 6-8 wks worsened over 6-8wks  diagnosed with ulcerative colitis 03/22, started Entyvio infusion 05/22, partial nephrectomy 6wks ago pt on Biotin,) and check spot (R cheek, yrs, no changes).  The following portions of the chart were reviewed this encounter and updated as appropriate:   Allergies  Meds  Problems  Med Hx  Surg Hx  Fam Hx     Review of Systems:  No other skin or systemic complaints except as noted in HPI or Assessment and Plan.  Objective  Well appearing patient in no apparent distress; mood and affect are within normal limits.  A focused examination was performed including scalp. Relevant physical exam findings are noted in the Assessment and Plan.  Scalp Generalized thinning, temporal area with new hairs  R cheek Stuck-on, waxy, tan-brown papules and plaques -- Discussed benign etiology and prognosis.    Assessment & Plan   Seborrheic Keratoses - Stuck-on, waxy, tan-brown papules and/or plaques  - Benign-appearing - Discussed benign etiology and prognosis. - Observe - Call for any changes  Telogen effluvium Scalp  R/t physical stress/illness and mental stress - recent diagnosis of Ulcerative colitis 03/22, recent partial nephrectomy 6 wks ago,    Telogen effluvium is a benign, self limited condition causing increased hair shedding usually for several months. It does not progress to baldness. It can be triggered by recent illness, recent surgery, thyroid disease, low iron stores, vitamin D deficiency, fad diets or rapid weight loss, hormonal changes such as pregnancy or birth control pills, and some medication. Usually the hair loss starts 2-3 months after the illness or health change. Rarely, it can continue for longer than a year.  Discussed may not get 100% of hair back but should improve in time.  Article on Telogen Effluvium given to  pt.  Cont Biotin 2.'5mg'$  qd Recomend starting Rogaine 5% bid to scalp Recommend red light hair treatment, Hair max, Capillus, Revian  Seborrheic keratosis R cheek Benign, observe.    Return if symptoms worsen or fail to improve.  I, Othelia Pulling, RMA, am acting as scribe for Sarina Ser, MD . Documentation: I have reviewed the above documentation for accuracy and completeness, and I agree with the above.  Sarina Ser, MD

## 2021-08-06 ENCOUNTER — Encounter: Payer: Self-pay | Admitting: Dermatology

## 2021-10-10 DIAGNOSIS — Z79899 Other long term (current) drug therapy: Secondary | ICD-10-CM | POA: Insufficient documentation

## 2021-11-12 DIAGNOSIS — M2391 Unspecified internal derangement of right knee: Secondary | ICD-10-CM | POA: Insufficient documentation

## 2021-12-18 DIAGNOSIS — Z8052 Family history of malignant neoplasm of bladder: Secondary | ICD-10-CM | POA: Diagnosis not present

## 2021-12-18 DIAGNOSIS — Z79899 Other long term (current) drug therapy: Secondary | ICD-10-CM | POA: Diagnosis not present

## 2021-12-18 DIAGNOSIS — R918 Other nonspecific abnormal finding of lung field: Secondary | ICD-10-CM | POA: Diagnosis not present

## 2021-12-18 DIAGNOSIS — R0602 Shortness of breath: Secondary | ICD-10-CM | POA: Diagnosis not present

## 2021-12-18 DIAGNOSIS — R0789 Other chest pain: Secondary | ICD-10-CM | POA: Diagnosis not present

## 2021-12-18 DIAGNOSIS — J479 Bronchiectasis, uncomplicated: Secondary | ICD-10-CM | POA: Diagnosis not present

## 2021-12-18 DIAGNOSIS — Z87891 Personal history of nicotine dependence: Secondary | ICD-10-CM | POA: Diagnosis not present

## 2021-12-18 DIAGNOSIS — Z800 Family history of malignant neoplasm of digestive organs: Secondary | ICD-10-CM | POA: Diagnosis not present

## 2021-12-23 DIAGNOSIS — A319 Mycobacterial infection, unspecified: Secondary | ICD-10-CM | POA: Diagnosis not present

## 2021-12-23 DIAGNOSIS — D649 Anemia, unspecified: Secondary | ICD-10-CM | POA: Diagnosis not present

## 2021-12-23 DIAGNOSIS — F419 Anxiety disorder, unspecified: Secondary | ICD-10-CM | POA: Diagnosis not present

## 2021-12-23 DIAGNOSIS — K5100 Ulcerative (chronic) pancolitis without complications: Secondary | ICD-10-CM | POA: Diagnosis not present

## 2021-12-29 DIAGNOSIS — E042 Nontoxic multinodular goiter: Secondary | ICD-10-CM | POA: Insufficient documentation

## 2021-12-29 DIAGNOSIS — Z905 Acquired absence of kidney: Secondary | ICD-10-CM | POA: Insufficient documentation

## 2021-12-30 DIAGNOSIS — Z79899 Other long term (current) drug therapy: Secondary | ICD-10-CM | POA: Diagnosis not present

## 2021-12-30 DIAGNOSIS — K52831 Collagenous colitis: Secondary | ICD-10-CM | POA: Diagnosis not present

## 2021-12-30 DIAGNOSIS — K5100 Ulcerative (chronic) pancolitis without complications: Secondary | ICD-10-CM | POA: Diagnosis not present

## 2021-12-31 DIAGNOSIS — K5100 Ulcerative (chronic) pancolitis without complications: Secondary | ICD-10-CM | POA: Diagnosis not present

## 2021-12-31 DIAGNOSIS — K52831 Collagenous colitis: Secondary | ICD-10-CM | POA: Diagnosis not present

## 2021-12-31 DIAGNOSIS — Z79899 Other long term (current) drug therapy: Secondary | ICD-10-CM | POA: Diagnosis not present

## 2022-01-01 DIAGNOSIS — Z800 Family history of malignant neoplasm of digestive organs: Secondary | ICD-10-CM | POA: Insufficient documentation

## 2022-01-01 DIAGNOSIS — K219 Gastro-esophageal reflux disease without esophagitis: Secondary | ICD-10-CM | POA: Insufficient documentation

## 2022-01-01 DIAGNOSIS — C641 Malignant neoplasm of right kidney, except renal pelvis: Secondary | ICD-10-CM | POA: Diagnosis not present

## 2022-01-01 DIAGNOSIS — Z860101 Personal history of adenomatous and serrated colon polyps: Secondary | ICD-10-CM | POA: Insufficient documentation

## 2022-01-01 DIAGNOSIS — I10 Essential (primary) hypertension: Secondary | ICD-10-CM | POA: Diagnosis not present

## 2022-01-01 DIAGNOSIS — M1712 Unilateral primary osteoarthritis, left knee: Secondary | ICD-10-CM | POA: Diagnosis not present

## 2022-01-01 DIAGNOSIS — I341 Nonrheumatic mitral (valve) prolapse: Secondary | ICD-10-CM | POA: Diagnosis not present

## 2022-01-20 DIAGNOSIS — E039 Hypothyroidism, unspecified: Secondary | ICD-10-CM | POA: Diagnosis not present

## 2022-01-21 DIAGNOSIS — K5100 Ulcerative (chronic) pancolitis without complications: Secondary | ICD-10-CM | POA: Diagnosis not present

## 2022-01-21 DIAGNOSIS — R197 Diarrhea, unspecified: Secondary | ICD-10-CM | POA: Diagnosis not present

## 2022-01-21 DIAGNOSIS — K6389 Other specified diseases of intestine: Secondary | ICD-10-CM | POA: Diagnosis not present

## 2022-01-21 DIAGNOSIS — K5130 Ulcerative (chronic) rectosigmoiditis without complications: Secondary | ICD-10-CM | POA: Diagnosis not present

## 2022-01-21 DIAGNOSIS — I341 Nonrheumatic mitral (valve) prolapse: Secondary | ICD-10-CM | POA: Diagnosis not present

## 2022-01-21 DIAGNOSIS — K5190 Ulcerative colitis, unspecified, without complications: Secondary | ICD-10-CM | POA: Diagnosis not present

## 2022-01-21 DIAGNOSIS — K6289 Other specified diseases of anus and rectum: Secondary | ICD-10-CM | POA: Diagnosis not present

## 2022-01-21 DIAGNOSIS — Z885 Allergy status to narcotic agent status: Secondary | ICD-10-CM | POA: Diagnosis not present

## 2022-01-21 DIAGNOSIS — K529 Noninfective gastroenteritis and colitis, unspecified: Secondary | ICD-10-CM | POA: Diagnosis not present

## 2022-01-21 DIAGNOSIS — T380X5A Adverse effect of glucocorticoids and synthetic analogues, initial encounter: Secondary | ICD-10-CM | POA: Diagnosis not present

## 2022-01-21 DIAGNOSIS — K580 Irritable bowel syndrome with diarrhea: Secondary | ICD-10-CM | POA: Diagnosis not present

## 2022-01-21 DIAGNOSIS — Z79620 Long term (current) use of immunosuppressive biologic: Secondary | ICD-10-CM | POA: Diagnosis not present

## 2022-01-21 DIAGNOSIS — N189 Chronic kidney disease, unspecified: Secondary | ICD-10-CM | POA: Diagnosis not present

## 2022-01-21 DIAGNOSIS — D500 Iron deficiency anemia secondary to blood loss (chronic): Secondary | ICD-10-CM | POA: Diagnosis not present

## 2022-01-21 DIAGNOSIS — Z882 Allergy status to sulfonamides status: Secondary | ICD-10-CM | POA: Diagnosis not present

## 2022-01-21 DIAGNOSIS — Z888 Allergy status to other drugs, medicaments and biological substances status: Secondary | ICD-10-CM | POA: Diagnosis not present

## 2022-01-21 DIAGNOSIS — I129 Hypertensive chronic kidney disease with stage 1 through stage 4 chronic kidney disease, or unspecified chronic kidney disease: Secondary | ICD-10-CM | POA: Diagnosis not present

## 2022-01-21 DIAGNOSIS — I10 Essential (primary) hypertension: Secondary | ICD-10-CM | POA: Diagnosis not present

## 2022-01-21 DIAGNOSIS — Z87891 Personal history of nicotine dependence: Secondary | ICD-10-CM | POA: Diagnosis not present

## 2022-01-21 DIAGNOSIS — E039 Hypothyroidism, unspecified: Secondary | ICD-10-CM | POA: Diagnosis not present

## 2022-01-21 DIAGNOSIS — K219 Gastro-esophageal reflux disease without esophagitis: Secondary | ICD-10-CM | POA: Diagnosis not present

## 2022-01-21 DIAGNOSIS — K631 Perforation of intestine (nontraumatic): Secondary | ICD-10-CM | POA: Diagnosis not present

## 2022-01-21 DIAGNOSIS — K52831 Collagenous colitis: Secondary | ICD-10-CM | POA: Diagnosis not present

## 2022-01-21 DIAGNOSIS — Z79899 Other long term (current) drug therapy: Secondary | ICD-10-CM | POA: Diagnosis not present

## 2022-01-21 DIAGNOSIS — K648 Other hemorrhoids: Secondary | ICD-10-CM | POA: Diagnosis not present

## 2022-01-21 DIAGNOSIS — Z7952 Long term (current) use of systemic steroids: Secondary | ICD-10-CM | POA: Diagnosis not present

## 2022-01-30 DIAGNOSIS — K5100 Ulcerative (chronic) pancolitis without complications: Secondary | ICD-10-CM | POA: Diagnosis not present

## 2022-02-13 DIAGNOSIS — Z79899 Other long term (current) drug therapy: Secondary | ICD-10-CM | POA: Diagnosis not present

## 2022-02-13 DIAGNOSIS — D500 Iron deficiency anemia secondary to blood loss (chronic): Secondary | ICD-10-CM | POA: Diagnosis not present

## 2022-02-13 DIAGNOSIS — K5100 Ulcerative (chronic) pancolitis without complications: Secondary | ICD-10-CM | POA: Diagnosis not present

## 2022-02-13 DIAGNOSIS — K219 Gastro-esophageal reflux disease without esophagitis: Secondary | ICD-10-CM | POA: Diagnosis not present

## 2022-02-16 DIAGNOSIS — K5100 Ulcerative (chronic) pancolitis without complications: Secondary | ICD-10-CM | POA: Diagnosis not present

## 2022-02-19 DIAGNOSIS — M1712 Unilateral primary osteoarthritis, left knee: Secondary | ICD-10-CM | POA: Diagnosis not present

## 2022-02-19 DIAGNOSIS — M25562 Pain in left knee: Secondary | ICD-10-CM | POA: Diagnosis not present

## 2022-02-19 DIAGNOSIS — K5100 Ulcerative (chronic) pancolitis without complications: Secondary | ICD-10-CM | POA: Diagnosis not present

## 2022-02-25 DIAGNOSIS — D472 Monoclonal gammopathy: Secondary | ICD-10-CM | POA: Diagnosis not present

## 2022-02-25 DIAGNOSIS — Z87891 Personal history of nicotine dependence: Secondary | ICD-10-CM | POA: Diagnosis not present

## 2022-02-25 DIAGNOSIS — Z9049 Acquired absence of other specified parts of digestive tract: Secondary | ICD-10-CM | POA: Diagnosis not present

## 2022-02-25 DIAGNOSIS — Z905 Acquired absence of kidney: Secondary | ICD-10-CM | POA: Diagnosis not present

## 2022-02-25 DIAGNOSIS — Z79899 Other long term (current) drug therapy: Secondary | ICD-10-CM | POA: Diagnosis not present

## 2022-02-25 DIAGNOSIS — D649 Anemia, unspecified: Secondary | ICD-10-CM | POA: Diagnosis not present

## 2022-03-06 DIAGNOSIS — Z79899 Other long term (current) drug therapy: Secondary | ICD-10-CM | POA: Diagnosis not present

## 2022-03-06 DIAGNOSIS — Z86010 Personal history of colonic polyps: Secondary | ICD-10-CM | POA: Diagnosis not present

## 2022-03-06 DIAGNOSIS — K5100 Ulcerative (chronic) pancolitis without complications: Secondary | ICD-10-CM | POA: Diagnosis not present

## 2022-03-10 DIAGNOSIS — G8929 Other chronic pain: Secondary | ICD-10-CM | POA: Diagnosis not present

## 2022-03-10 DIAGNOSIS — M25562 Pain in left knee: Secondary | ICD-10-CM | POA: Diagnosis not present

## 2022-03-16 DIAGNOSIS — K5100 Ulcerative (chronic) pancolitis without complications: Secondary | ICD-10-CM | POA: Diagnosis not present

## 2022-03-24 DIAGNOSIS — R911 Solitary pulmonary nodule: Secondary | ICD-10-CM | POA: Diagnosis not present

## 2022-03-24 DIAGNOSIS — G8929 Other chronic pain: Secondary | ICD-10-CM | POA: Diagnosis not present

## 2022-03-24 DIAGNOSIS — M25562 Pain in left knee: Secondary | ICD-10-CM | POA: Diagnosis not present

## 2022-03-24 DIAGNOSIS — J479 Bronchiectasis, uncomplicated: Secondary | ICD-10-CM | POA: Diagnosis not present

## 2022-03-31 DIAGNOSIS — N952 Postmenopausal atrophic vaginitis: Secondary | ICD-10-CM | POA: Diagnosis not present

## 2022-03-31 DIAGNOSIS — Z124 Encounter for screening for malignant neoplasm of cervix: Secondary | ICD-10-CM | POA: Diagnosis not present

## 2022-03-31 DIAGNOSIS — Z1231 Encounter for screening mammogram for malignant neoplasm of breast: Secondary | ICD-10-CM | POA: Diagnosis not present

## 2022-03-31 DIAGNOSIS — Z79890 Hormone replacement therapy (postmenopausal): Secondary | ICD-10-CM | POA: Diagnosis not present

## 2022-04-07 DIAGNOSIS — E039 Hypothyroidism, unspecified: Secondary | ICD-10-CM | POA: Diagnosis not present

## 2022-04-09 DIAGNOSIS — R911 Solitary pulmonary nodule: Secondary | ICD-10-CM | POA: Diagnosis not present

## 2022-04-09 DIAGNOSIS — J479 Bronchiectasis, uncomplicated: Secondary | ICD-10-CM | POA: Diagnosis not present

## 2022-04-09 DIAGNOSIS — R0602 Shortness of breath: Secondary | ICD-10-CM | POA: Diagnosis not present

## 2022-04-14 DIAGNOSIS — G8929 Other chronic pain: Secondary | ICD-10-CM | POA: Diagnosis not present

## 2022-04-14 DIAGNOSIS — M25562 Pain in left knee: Secondary | ICD-10-CM | POA: Diagnosis not present

## 2022-04-15 DIAGNOSIS — E042 Nontoxic multinodular goiter: Secondary | ICD-10-CM | POA: Diagnosis not present

## 2022-04-15 DIAGNOSIS — Z905 Acquired absence of kidney: Secondary | ICD-10-CM | POA: Diagnosis not present

## 2022-04-15 DIAGNOSIS — K529 Noninfective gastroenteritis and colitis, unspecified: Secondary | ICD-10-CM | POA: Diagnosis not present

## 2022-04-15 DIAGNOSIS — E039 Hypothyroidism, unspecified: Secondary | ICD-10-CM | POA: Diagnosis not present

## 2022-04-20 DIAGNOSIS — C641 Malignant neoplasm of right kidney, except renal pelvis: Secondary | ICD-10-CM | POA: Diagnosis not present

## 2022-04-20 DIAGNOSIS — K5100 Ulcerative (chronic) pancolitis without complications: Secondary | ICD-10-CM | POA: Diagnosis not present

## 2022-04-20 DIAGNOSIS — E039 Hypothyroidism, unspecified: Secondary | ICD-10-CM | POA: Diagnosis not present

## 2022-04-20 DIAGNOSIS — K631 Perforation of intestine (nontraumatic): Secondary | ICD-10-CM | POA: Diagnosis not present

## 2022-04-24 DIAGNOSIS — Z79890 Hormone replacement therapy (postmenopausal): Secondary | ICD-10-CM | POA: Diagnosis not present

## 2022-04-24 DIAGNOSIS — J209 Acute bronchitis, unspecified: Secondary | ICD-10-CM | POA: Diagnosis not present

## 2022-04-24 DIAGNOSIS — R918 Other nonspecific abnormal finding of lung field: Secondary | ICD-10-CM | POA: Diagnosis not present

## 2022-04-24 DIAGNOSIS — Z800 Family history of malignant neoplasm of digestive organs: Secondary | ICD-10-CM | POA: Diagnosis not present

## 2022-04-24 DIAGNOSIS — Z86018 Personal history of other benign neoplasm: Secondary | ICD-10-CM | POA: Diagnosis not present

## 2022-04-24 DIAGNOSIS — Z803 Family history of malignant neoplasm of breast: Secondary | ICD-10-CM | POA: Diagnosis not present

## 2022-04-24 DIAGNOSIS — I10 Essential (primary) hypertension: Secondary | ICD-10-CM | POA: Diagnosis not present

## 2022-04-24 DIAGNOSIS — Z905 Acquired absence of kidney: Secondary | ICD-10-CM | POA: Diagnosis not present

## 2022-04-24 DIAGNOSIS — K5100 Ulcerative (chronic) pancolitis without complications: Secondary | ICD-10-CM | POA: Diagnosis not present

## 2022-04-24 DIAGNOSIS — R911 Solitary pulmonary nodule: Secondary | ICD-10-CM | POA: Diagnosis not present

## 2022-04-24 DIAGNOSIS — J8409 Other alveolar and parieto-alveolar conditions: Secondary | ICD-10-CM | POA: Diagnosis not present

## 2022-04-24 DIAGNOSIS — E039 Hypothyroidism, unspecified: Secondary | ICD-10-CM | POA: Diagnosis not present

## 2022-04-24 DIAGNOSIS — D638 Anemia in other chronic diseases classified elsewhere: Secondary | ICD-10-CM | POA: Diagnosis not present

## 2022-04-24 DIAGNOSIS — Z87891 Personal history of nicotine dependence: Secondary | ICD-10-CM | POA: Diagnosis not present

## 2022-04-24 DIAGNOSIS — M1712 Unilateral primary osteoarthritis, left knee: Secondary | ICD-10-CM | POA: Diagnosis not present

## 2022-04-24 DIAGNOSIS — Z8052 Family history of malignant neoplasm of bladder: Secondary | ICD-10-CM | POA: Diagnosis not present

## 2022-04-24 DIAGNOSIS — Z79899 Other long term (current) drug therapy: Secondary | ICD-10-CM | POA: Diagnosis not present

## 2022-05-05 ENCOUNTER — Ambulatory Visit: Payer: Medicare Other | Admitting: Urology

## 2022-05-05 VITALS — BP 134/81 | HR 92 | Ht 66.0 in | Wt 148.0 lb

## 2022-05-05 DIAGNOSIS — Z905 Acquired absence of kidney: Secondary | ICD-10-CM

## 2022-05-05 DIAGNOSIS — Z87440 Personal history of urinary (tract) infections: Secondary | ICD-10-CM | POA: Diagnosis not present

## 2022-05-05 LAB — URINALYSIS, COMPLETE
Bilirubin, UA: NEGATIVE
Glucose, UA: NEGATIVE
Ketones, UA: NEGATIVE
Leukocytes,UA: NEGATIVE
Nitrite, UA: NEGATIVE
Protein,UA: NEGATIVE
RBC, UA: NEGATIVE
Specific Gravity, UA: 1.01 (ref 1.005–1.030)
Urobilinogen, Ur: 0.2 mg/dL (ref 0.2–1.0)
pH, UA: 6 (ref 5.0–7.5)

## 2022-05-05 LAB — MICROSCOPIC EXAMINATION: Bacteria, UA: NONE SEEN

## 2022-05-05 NOTE — Progress Notes (Signed)
05/06/22 8:40 AM   [name] September 18, 1956 [id number]  Referring provider:  Hortencia Pilar, MD [address] [place] [address] [place],  [place] [address] Chief Complaint  Patient presents with   partial nephrectomy     HPI: [name] is a 66 y.o.female who presents today to establish care.   She is s/p partial nephrectomy at [facility] in 05/2021 for a 1.6 cm right upper pole renal mass. 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  No further follow-up was recommended.  Post-op she was treated in 07/2021 for UTI and acute pyelonephritis.   She report that she was diagnosed with ulcerative colitis and had to get that under control in order to be able to have partial nephrectomy. She reports that she feels what feels like scar tissue near her incision site in her right flank area, palpable abnormality but not painful.  She does also have a personal history of recurrent urinary tract infections but infrequent over the past several years.  Her primary impetus for today's visit was to establish care with urologist so that she has a local provider.  Her urologist at [facility] is since left the practice.  PMH: Past Medical History:  Diagnosis Date   Colon polyps 2004   Goiter    dx in her 32s    Surgical History: No past surgical history on file.  Home Medications:  Allergies as of 05/05/2022       Reactions   Codeine Itching, Nausea Only   Mesalamine Nausea Only   Other reaction(s): Abdominal Pain   Sulfa Antibiotics Itching   Nisoldipine Rash        Medication List        Accurate as of May 05, 2022 11:59 PM. If you have any questions, ask your nurse or doctor.          albuterol 108 (90 Base) MCG/ACT inhaler Commonly known as: VENTOLIN HFA Inhale into the lungs.   amoxicillin 500 MG capsule Commonly known as: AMOXIL Take 500 mg by mouth 3 (three) times daily.   carvedilol 12.5 MG tablet Commonly known as: COREG Take 12.5 mg by mouth 2 (two)  times daily.   desipramine 25 MG tablet Commonly known as: NORPRAMIN Take by mouth.   Entyvio 300 MG injection Generic drug: vedolizumab Inject into the vein.   estradiol 0.1 MG/GM vaginal cream Commonly known as: ESTRACE Place vaginally.   ferrous sulfate 325 (65 FE) MG tablet Take 325 mg by mouth 2 (two) times daily.   levothyroxine 112 MCG tablet Commonly known as: SYNTHROID Take 112 mcg by mouth daily.   levothyroxine 125 MCG tablet Commonly known as: SYNTHROID Take 125 mcg by mouth daily.   Multi-Vitamin tablet Take 1 tablet by mouth daily.   omeprazole 20 MG capsule Commonly known as: PRILOSEC Take 20 mg by mouth daily.   omeprazole 40 MG capsule Commonly known as: PRILOSEC Take 40 mg by mouth daily.   predniSONE 10 MG tablet Commonly known as: DELTASONE Take by mouth.        Allergies:  Allergies  Allergen Reactions   Codeine Itching and Nausea Only   Mesalamine Nausea Only    Other reaction(s): Abdominal Pain   Sulfa Antibiotics Itching   Nisoldipine Rash    Family History: Family History  Problem Relation Age of Onset   Colon cancer Mother        dx in her 82s   Kidney cancer Mother 69  slow growing   Cancer Father        squamous cell cancer in the mouth; smoker and [employer] - syphoned gasoline   Bladder Cancer Brother 44       smoker for 40 years   Breast cancer Maternal Aunt 49   COPD Maternal Uncle    Dementia Maternal Grandmother    Heart disease Maternal Grandfather    Pancreatic cancer Cousin 90       smoker   Breast cancer Paternal Aunt        dx >40 years of age    Social History:  reports that she quit smoking about 42 years ago. Her smoking use included cigarettes. She has a 6.25 pack-year smoking history. She does not have any smokeless tobacco history on file. She reports current alcohol use. No history on file for drug use.   Physical Exam: BP 134/81   Pulse 92   Ht '5\' 6"'$  (1.676 m)   Wt 148 lb (67.1 kg)    BMI 23.89 kg/m   Constitutional:  Alert and oriented, No acute distress. HEENT: [name] AT, moist mucus membranes.  Trachea midline, no masses. Cardiovascular: No clubbing, cyanosis, or edema. Respiratory: Normal respiratory effort, no increased work of breathing. GI: Abdominal incisions well-healed.  No palpable abnormality in right flank area, no hernias or defects appreciated. Skin: No rashes, bruises or suspicious lesions. Neurologic: Grossly intact, no focal deficits, moving all 4 extremities. Psychiatric: Normal mood and affect.  Laboratory Data: No results found for: CREATININE No results found for: HGBA1C  Urinalysis Negative   Assessment & Plan:    History of renal mass  - Although pathology was oncocytic tumor, may be reasonable to repeat a renal ultrasound next year for reassurance -Further follow-up thereafter if this is unremarkable  2. History of rUTIs  - She has not had any recent documented infections but based on her history  she wants to be affiliated with urological practces incase she has any issues   1 year with renal ultrasound prior  [name] as a scribe for Kendra Espy, MD.,have documented all relevant documentation on the behalf of Kendra Espy, MD,as directed by  Kendra Espy, MD while in the presence of [name], [place] [address] [place], [place] [address] [phone]

## 2022-05-07 DIAGNOSIS — M1712 Unilateral primary osteoarthritis, left knee: Secondary | ICD-10-CM | POA: Diagnosis not present

## 2022-05-11 DIAGNOSIS — K5100 Ulcerative (chronic) pancolitis without complications: Secondary | ICD-10-CM | POA: Diagnosis not present

## 2022-05-14 DIAGNOSIS — K5100 Ulcerative (chronic) pancolitis without complications: Secondary | ICD-10-CM | POA: Diagnosis not present

## 2022-05-27 DIAGNOSIS — K5100 Ulcerative (chronic) pancolitis without complications: Secondary | ICD-10-CM | POA: Diagnosis not present

## 2022-06-12 DIAGNOSIS — K5100 Ulcerative (chronic) pancolitis without complications: Secondary | ICD-10-CM | POA: Diagnosis not present

## 2022-06-12 DIAGNOSIS — Z79899 Other long term (current) drug therapy: Secondary | ICD-10-CM | POA: Diagnosis not present

## 2022-06-12 DIAGNOSIS — K219 Gastro-esophageal reflux disease without esophagitis: Secondary | ICD-10-CM | POA: Diagnosis not present

## 2022-07-13 DIAGNOSIS — R221 Localized swelling, mass and lump, neck: Secondary | ICD-10-CM | POA: Diagnosis not present

## 2022-07-13 DIAGNOSIS — R600 Localized edema: Secondary | ICD-10-CM | POA: Diagnosis not present

## 2022-07-15 DIAGNOSIS — M2391 Unspecified internal derangement of right knee: Secondary | ICD-10-CM | POA: Diagnosis not present

## 2022-07-15 DIAGNOSIS — M1712 Unilateral primary osteoarthritis, left knee: Secondary | ICD-10-CM | POA: Diagnosis not present

## 2022-07-15 DIAGNOSIS — E039 Hypothyroidism, unspecified: Secondary | ICD-10-CM | POA: Diagnosis not present

## 2022-07-15 DIAGNOSIS — Z79899 Other long term (current) drug therapy: Secondary | ICD-10-CM | POA: Diagnosis not present

## 2022-07-15 DIAGNOSIS — M7631 Iliotibial band syndrome, right leg: Secondary | ICD-10-CM | POA: Diagnosis not present

## 2022-07-21 DIAGNOSIS — M1711 Unilateral primary osteoarthritis, right knee: Secondary | ICD-10-CM | POA: Diagnosis not present

## 2022-07-23 DIAGNOSIS — E042 Nontoxic multinodular goiter: Secondary | ICD-10-CM | POA: Diagnosis not present

## 2022-07-23 DIAGNOSIS — E039 Hypothyroidism, unspecified: Secondary | ICD-10-CM | POA: Diagnosis not present

## 2022-07-23 DIAGNOSIS — Z905 Acquired absence of kidney: Secondary | ICD-10-CM | POA: Diagnosis not present

## 2022-07-24 DIAGNOSIS — Z1231 Encounter for screening mammogram for malignant neoplasm of breast: Secondary | ICD-10-CM | POA: Diagnosis not present

## 2022-07-24 DIAGNOSIS — N6310 Unspecified lump in the right breast, unspecified quadrant: Secondary | ICD-10-CM | POA: Diagnosis not present

## 2022-07-24 DIAGNOSIS — N6320 Unspecified lump in the left breast, unspecified quadrant: Secondary | ICD-10-CM | POA: Diagnosis not present

## 2022-07-28 DIAGNOSIS — K631 Perforation of intestine (nontraumatic): Secondary | ICD-10-CM | POA: Diagnosis not present

## 2022-07-28 DIAGNOSIS — Z01818 Encounter for other preprocedural examination: Secondary | ICD-10-CM | POA: Diagnosis not present

## 2022-07-28 DIAGNOSIS — Z905 Acquired absence of kidney: Secondary | ICD-10-CM | POA: Diagnosis not present

## 2022-07-28 DIAGNOSIS — I340 Nonrheumatic mitral (valve) insufficiency: Secondary | ICD-10-CM | POA: Diagnosis not present

## 2022-07-28 DIAGNOSIS — I10 Essential (primary) hypertension: Secondary | ICD-10-CM | POA: Diagnosis not present

## 2022-07-28 DIAGNOSIS — R911 Solitary pulmonary nodule: Secondary | ICD-10-CM | POA: Diagnosis not present

## 2022-07-28 DIAGNOSIS — M1712 Unilateral primary osteoarthritis, left knee: Secondary | ICD-10-CM | POA: Diagnosis not present

## 2022-07-28 DIAGNOSIS — K219 Gastro-esophageal reflux disease without esophagitis: Secondary | ICD-10-CM | POA: Diagnosis not present

## 2022-07-28 DIAGNOSIS — I341 Nonrheumatic mitral (valve) prolapse: Secondary | ICD-10-CM | POA: Diagnosis not present

## 2022-07-28 DIAGNOSIS — D649 Anemia, unspecified: Secondary | ICD-10-CM | POA: Diagnosis not present

## 2022-07-28 DIAGNOSIS — M25562 Pain in left knee: Secondary | ICD-10-CM | POA: Diagnosis not present

## 2022-07-28 DIAGNOSIS — E042 Nontoxic multinodular goiter: Secondary | ICD-10-CM | POA: Diagnosis not present

## 2022-07-28 DIAGNOSIS — E039 Hypothyroidism, unspecified: Secondary | ICD-10-CM | POA: Diagnosis not present

## 2022-07-28 DIAGNOSIS — C641 Malignant neoplasm of right kidney, except renal pelvis: Secondary | ICD-10-CM | POA: Diagnosis not present

## 2022-07-28 DIAGNOSIS — K580 Irritable bowel syndrome with diarrhea: Secondary | ICD-10-CM | POA: Diagnosis not present

## 2022-07-28 DIAGNOSIS — K5100 Ulcerative (chronic) pancolitis without complications: Secondary | ICD-10-CM | POA: Diagnosis not present

## 2022-09-10 DIAGNOSIS — I10 Essential (primary) hypertension: Secondary | ICD-10-CM | POA: Diagnosis not present

## 2022-09-10 DIAGNOSIS — Z79899 Other long term (current) drug therapy: Secondary | ICD-10-CM | POA: Diagnosis not present

## 2022-09-10 DIAGNOSIS — I341 Nonrheumatic mitral (valve) prolapse: Secondary | ICD-10-CM | POA: Diagnosis not present

## 2022-09-10 DIAGNOSIS — G8929 Other chronic pain: Secondary | ICD-10-CM | POA: Diagnosis not present

## 2022-09-10 DIAGNOSIS — M21162 Varus deformity, not elsewhere classified, left knee: Secondary | ICD-10-CM | POA: Diagnosis not present

## 2022-09-10 DIAGNOSIS — K219 Gastro-esophageal reflux disease without esophagitis: Secondary | ICD-10-CM | POA: Diagnosis not present

## 2022-09-10 DIAGNOSIS — Z87891 Personal history of nicotine dependence: Secondary | ICD-10-CM | POA: Diagnosis not present

## 2022-09-10 DIAGNOSIS — Z793 Long term (current) use of hormonal contraceptives: Secondary | ICD-10-CM | POA: Diagnosis not present

## 2022-09-10 DIAGNOSIS — Z9889 Other specified postprocedural states: Secondary | ICD-10-CM | POA: Diagnosis not present

## 2022-09-10 DIAGNOSIS — D631 Anemia in chronic kidney disease: Secondary | ICD-10-CM | POA: Diagnosis not present

## 2022-09-10 DIAGNOSIS — M1712 Unilateral primary osteoarthritis, left knee: Secondary | ICD-10-CM | POA: Diagnosis not present

## 2022-09-10 DIAGNOSIS — I129 Hypertensive chronic kidney disease with stage 1 through stage 4 chronic kidney disease, or unspecified chronic kidney disease: Secondary | ICD-10-CM | POA: Diagnosis not present

## 2022-09-10 DIAGNOSIS — K3189 Other diseases of stomach and duodenum: Secondary | ICD-10-CM | POA: Diagnosis not present

## 2022-09-10 DIAGNOSIS — N189 Chronic kidney disease, unspecified: Secondary | ICD-10-CM | POA: Diagnosis not present

## 2022-09-10 DIAGNOSIS — K5100 Ulcerative (chronic) pancolitis without complications: Secondary | ICD-10-CM | POA: Diagnosis not present

## 2022-09-10 DIAGNOSIS — M24562 Contracture, left knee: Secondary | ICD-10-CM | POA: Diagnosis not present

## 2022-09-10 DIAGNOSIS — I340 Nonrheumatic mitral (valve) insufficiency: Secondary | ICD-10-CM | POA: Diagnosis not present

## 2022-09-10 DIAGNOSIS — E039 Hypothyroidism, unspecified: Secondary | ICD-10-CM | POA: Diagnosis not present

## 2022-09-10 DIAGNOSIS — Z79890 Hormone replacement therapy (postmenopausal): Secondary | ICD-10-CM | POA: Diagnosis not present

## 2022-09-10 DIAGNOSIS — G8918 Other acute postprocedural pain: Secondary | ICD-10-CM | POA: Diagnosis not present

## 2022-09-16 DIAGNOSIS — Z96659 Presence of unspecified artificial knee joint: Secondary | ICD-10-CM | POA: Diagnosis not present

## 2022-09-16 DIAGNOSIS — M79662 Pain in left lower leg: Secondary | ICD-10-CM | POA: Diagnosis not present

## 2022-09-30 DIAGNOSIS — Z96652 Presence of left artificial knee joint: Secondary | ICD-10-CM | POA: Insufficient documentation

## 2022-10-05 DIAGNOSIS — M25562 Pain in left knee: Secondary | ICD-10-CM | POA: Diagnosis not present

## 2022-10-05 DIAGNOSIS — M25662 Stiffness of left knee, not elsewhere classified: Secondary | ICD-10-CM | POA: Diagnosis not present

## 2022-10-05 DIAGNOSIS — Z96652 Presence of left artificial knee joint: Secondary | ICD-10-CM | POA: Diagnosis not present

## 2022-10-05 DIAGNOSIS — R2681 Unsteadiness on feet: Secondary | ICD-10-CM | POA: Diagnosis not present

## 2022-10-08 DIAGNOSIS — R2681 Unsteadiness on feet: Secondary | ICD-10-CM | POA: Diagnosis not present

## 2022-10-08 DIAGNOSIS — M25562 Pain in left knee: Secondary | ICD-10-CM | POA: Diagnosis not present

## 2022-10-08 DIAGNOSIS — Z96652 Presence of left artificial knee joint: Secondary | ICD-10-CM | POA: Diagnosis not present

## 2022-10-08 DIAGNOSIS — M25662 Stiffness of left knee, not elsewhere classified: Secondary | ICD-10-CM | POA: Diagnosis not present

## 2022-10-12 DIAGNOSIS — Z1331 Encounter for screening for depression: Secondary | ICD-10-CM | POA: Diagnosis not present

## 2022-10-12 DIAGNOSIS — R739 Hyperglycemia, unspecified: Secondary | ICD-10-CM | POA: Insufficient documentation

## 2022-10-12 DIAGNOSIS — I10 Essential (primary) hypertension: Secondary | ICD-10-CM | POA: Diagnosis not present

## 2022-10-12 DIAGNOSIS — Z0000 Encounter for general adult medical examination without abnormal findings: Secondary | ICD-10-CM | POA: Diagnosis not present

## 2022-10-12 DIAGNOSIS — E039 Hypothyroidism, unspecified: Secondary | ICD-10-CM | POA: Diagnosis not present

## 2022-10-13 DIAGNOSIS — M25662 Stiffness of left knee, not elsewhere classified: Secondary | ICD-10-CM | POA: Diagnosis not present

## 2022-10-13 DIAGNOSIS — Z96652 Presence of left artificial knee joint: Secondary | ICD-10-CM | POA: Diagnosis not present

## 2022-10-13 DIAGNOSIS — M25562 Pain in left knee: Secondary | ICD-10-CM | POA: Diagnosis not present

## 2022-10-13 DIAGNOSIS — R2681 Unsteadiness on feet: Secondary | ICD-10-CM | POA: Diagnosis not present

## 2022-10-23 DIAGNOSIS — M25562 Pain in left knee: Secondary | ICD-10-CM | POA: Diagnosis not present

## 2022-10-23 DIAGNOSIS — Z96652 Presence of left artificial knee joint: Secondary | ICD-10-CM | POA: Diagnosis not present

## 2022-10-23 DIAGNOSIS — M25662 Stiffness of left knee, not elsewhere classified: Secondary | ICD-10-CM | POA: Diagnosis not present

## 2022-10-23 DIAGNOSIS — R2681 Unsteadiness on feet: Secondary | ICD-10-CM | POA: Diagnosis not present

## 2022-10-27 DIAGNOSIS — I10 Essential (primary) hypertension: Secondary | ICD-10-CM | POA: Diagnosis not present

## 2022-10-27 DIAGNOSIS — I341 Nonrheumatic mitral (valve) prolapse: Secondary | ICD-10-CM | POA: Diagnosis not present

## 2022-10-29 DIAGNOSIS — R2681 Unsteadiness on feet: Secondary | ICD-10-CM | POA: Diagnosis not present

## 2022-10-29 DIAGNOSIS — M25562 Pain in left knee: Secondary | ICD-10-CM | POA: Diagnosis not present

## 2022-10-29 DIAGNOSIS — M25662 Stiffness of left knee, not elsewhere classified: Secondary | ICD-10-CM | POA: Diagnosis not present

## 2022-10-29 DIAGNOSIS — Z96652 Presence of left artificial knee joint: Secondary | ICD-10-CM | POA: Diagnosis not present

## 2022-11-02 DIAGNOSIS — M25562 Pain in left knee: Secondary | ICD-10-CM | POA: Diagnosis not present

## 2022-11-02 DIAGNOSIS — Z96652 Presence of left artificial knee joint: Secondary | ICD-10-CM | POA: Diagnosis not present

## 2022-11-02 DIAGNOSIS — M25662 Stiffness of left knee, not elsewhere classified: Secondary | ICD-10-CM | POA: Diagnosis not present

## 2022-11-02 DIAGNOSIS — R2681 Unsteadiness on feet: Secondary | ICD-10-CM | POA: Diagnosis not present

## 2022-11-03 DIAGNOSIS — K1121 Acute sialoadenitis: Secondary | ICD-10-CM | POA: Diagnosis not present

## 2022-11-06 DIAGNOSIS — K51918 Ulcerative colitis, unspecified with other complication: Secondary | ICD-10-CM | POA: Diagnosis not present

## 2022-11-06 DIAGNOSIS — K51018 Ulcerative (chronic) pancolitis with other complication: Secondary | ICD-10-CM | POA: Diagnosis not present

## 2022-11-06 DIAGNOSIS — Z79899 Other long term (current) drug therapy: Secondary | ICD-10-CM | POA: Diagnosis not present

## 2022-11-08 DIAGNOSIS — K5190 Ulcerative colitis, unspecified, without complications: Secondary | ICD-10-CM | POA: Insufficient documentation

## 2022-11-09 DIAGNOSIS — Z87891 Personal history of nicotine dependence: Secondary | ICD-10-CM | POA: Diagnosis not present

## 2022-11-09 DIAGNOSIS — R0602 Shortness of breath: Secondary | ICD-10-CM | POA: Diagnosis not present

## 2022-11-09 DIAGNOSIS — R948 Abnormal results of function studies of other organs and systems: Secondary | ICD-10-CM | POA: Diagnosis not present

## 2022-11-09 DIAGNOSIS — R918 Other nonspecific abnormal finding of lung field: Secondary | ICD-10-CM | POA: Diagnosis not present

## 2022-11-09 DIAGNOSIS — R0789 Other chest pain: Secondary | ICD-10-CM | POA: Diagnosis not present

## 2022-11-09 DIAGNOSIS — Z7969 Long term (current) use of other immunomodulators and immunosuppressants: Secondary | ICD-10-CM | POA: Diagnosis not present

## 2022-11-09 DIAGNOSIS — R197 Diarrhea, unspecified: Secondary | ICD-10-CM | POA: Diagnosis not present

## 2022-11-09 DIAGNOSIS — M542 Cervicalgia: Secondary | ICD-10-CM | POA: Diagnosis not present

## 2022-11-09 DIAGNOSIS — J479 Bronchiectasis, uncomplicated: Secondary | ICD-10-CM | POA: Diagnosis not present

## 2022-11-09 DIAGNOSIS — R911 Solitary pulmonary nodule: Secondary | ICD-10-CM | POA: Diagnosis not present

## 2022-11-17 DIAGNOSIS — K111 Hypertrophy of salivary gland: Secondary | ICD-10-CM | POA: Diagnosis not present

## 2022-11-17 DIAGNOSIS — K115 Sialolithiasis: Secondary | ICD-10-CM | POA: Diagnosis not present

## 2022-11-18 DIAGNOSIS — M1711 Unilateral primary osteoarthritis, right knee: Secondary | ICD-10-CM | POA: Diagnosis not present

## 2022-11-23 DIAGNOSIS — M109 Gout, unspecified: Secondary | ICD-10-CM | POA: Diagnosis not present

## 2023-01-21 DIAGNOSIS — E039 Hypothyroidism, unspecified: Secondary | ICD-10-CM | POA: Diagnosis not present

## 2023-01-21 DIAGNOSIS — I059 Rheumatic mitral valve disease, unspecified: Secondary | ICD-10-CM | POA: Diagnosis not present

## 2023-01-21 DIAGNOSIS — E042 Nontoxic multinodular goiter: Secondary | ICD-10-CM | POA: Diagnosis not present

## 2023-01-25 DIAGNOSIS — K5100 Ulcerative (chronic) pancolitis without complications: Secondary | ICD-10-CM | POA: Diagnosis not present

## 2023-01-25 DIAGNOSIS — K219 Gastro-esophageal reflux disease without esophagitis: Secondary | ICD-10-CM | POA: Diagnosis not present

## 2023-01-25 DIAGNOSIS — Z905 Acquired absence of kidney: Secondary | ICD-10-CM | POA: Diagnosis not present

## 2023-01-25 DIAGNOSIS — I340 Nonrheumatic mitral (valve) insufficiency: Secondary | ICD-10-CM | POA: Diagnosis not present

## 2023-01-25 DIAGNOSIS — Z01818 Encounter for other preprocedural examination: Secondary | ICD-10-CM | POA: Diagnosis not present

## 2023-01-25 DIAGNOSIS — I341 Nonrheumatic mitral (valve) prolapse: Secondary | ICD-10-CM | POA: Diagnosis not present

## 2023-01-26 DIAGNOSIS — N3000 Acute cystitis without hematuria: Secondary | ICD-10-CM | POA: Diagnosis not present

## 2023-01-26 DIAGNOSIS — R300 Dysuria: Secondary | ICD-10-CM | POA: Diagnosis not present

## 2023-01-29 NOTE — Progress Notes (Unsigned)
02/01/2023 4:50 PM   [name] 1956/10/16 [id number]  Referring provider: Hortencia Miranda, [place], [place] [id number] [place],  [place] [address]  Urological history: 1. Renal mass -s/p partial nephrectomy at [facility] in 05/2021 for a 1.6 cm right upper pole renal mass. 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 rUTI's -Contributing factors of age and vaginal atrophy -Documented urine cultures over the last year  None  Chief Complaint  Patient presents with   Recurrent UTI    HPI: [name] is a 67 y.o. female who presents today for poss UTI burning urinating, pt going to Urgent Care if symptoms worsen wanted a "check up" appt.  She was seen at [facility] in [facility] for dysuria for several days and decreased appetite.  Urinalysis notable for 1+ leukocyte esterase.  She was given Macrobid and urine culture was positive for mixed urogenital flora.    She did not take the Macrobid and the dysuria has abated.  Patient denies any modifying or aggravating factors.  Patient denies any gross hematuria, dysuria or suprapubic/flank pain.  Patient denies any fevers, chills, nausea or vomiting.    UA yellow clear, specific gravity 1.010, pH 6.0, squamous epithelial cells 0-5, WBCs 0-5 and rare bacteria.  She is applying the vaginal estrogen cream twice weekly.  She is up-to-date with her pelvic exams and Pap smears.  She has not noted any blood on her toilet paper when she wipes.   PMH: Past Medical History:  Diagnosis Date   Colon polyps 2004   Goiter    dx in her 23s    Surgical History: History reviewed. No pertinent surgical history.  Home Medications:  Allergies as of 02/01/2023       Reactions   Codeine Itching, Nausea Only   Hydromorphone Other (See Comments), Shortness Of Breath   Infliximab Anaphylaxis, Other (See Comments)   Mesalamine Nausea Only   Other reaction(s): Abdominal Pain   Sulfa Antibiotics Itching    Oxycodone Nausea And Vomiting   Vedolizumab Other (See Comments)   Developed antibodies; had UC flare   Nisoldipine Rash        Medication List        Accurate as of February 01, 2023  4:50 PM. If you have any questions, ask your nurse or doctor.          STOP taking these medications    Entyvio 300 MG injection Generic drug: vedolizumab Stopped by: Zara Council, PA-C       TAKE these medications    albuterol 108 (90 Base) MCG/ACT inhaler Commonly known as: VENTOLIN HFA Inhale into the lungs.   amoxicillin 500 MG capsule Commonly known as: AMOXIL Take 500 mg by mouth 3 (three) times daily.   carvedilol 12.5 MG tablet Commonly known as: COREG Take 12.5 mg by mouth 2 (two) times daily.   desipramine 25 MG tablet Commonly known as: NORPRAMIN Take by mouth.   estradiol 0.1 MG/GM vaginal cream Commonly known as: ESTRACE Place vaginally.   ferrous sulfate 325 (65 FE) MG tablet Take 325 mg by mouth 2 (two) times daily.   levothyroxine 112 MCG tablet Commonly known as: SYNTHROID Take 112 mcg by mouth daily. What changed: Another medication with the same name was removed. Continue taking this medication, and follow the directions you see here. Changed by: Zara Council, PA-C   Multi-Vitamin tablet Take 1 tablet by mouth daily.   omeprazole 40 MG  capsule Commonly known as: PRILOSEC Take 40 mg by mouth daily. What changed: Another medication with the same name was removed. Continue taking this medication, and follow the directions you see here. Changed by: Zara Council, PA-C   predniSONE 10 MG tablet Commonly known as: DELTASONE Take by mouth.   Stelara 45 MG/0.5ML Soln Generic drug: Ustekinumab Inject into the skin.        Allergies:  Allergies  Allergen Reactions   Codeine Itching and Nausea Only   Hydromorphone Other (See Comments) and Shortness Of Breath   Infliximab Anaphylaxis and Other (See Comments)   Mesalamine Nausea Only     Other reaction(s): Abdominal Pain   Sulfa Antibiotics Itching   Oxycodone Nausea And Vomiting   Vedolizumab Other (See Comments)    Developed antibodies; had UC flare   Nisoldipine Rash    Family History: Family History  Problem Relation Age of Onset   Colon cancer Mother        dx in her 23s   Kidney cancer Mother 17       slow growing   Cancer Father        squamous cell cancer in the mouth; smoker and [employer] - syphoned gasoline   Bladder Cancer Brother 46       smoker for 40 years   Breast cancer Maternal Aunt 49   COPD Maternal Uncle    Dementia Maternal Grandmother    Heart disease Maternal Grandfather    Pancreatic cancer Cousin 48       smoker   Breast cancer Paternal Aunt        dx >31 years of age    Social History:  reports that she quit smoking about 42 years ago. Her smoking use included cigarettes. She has a 6.25 pack-year smoking history. She does not have any smokeless tobacco history on file. She reports current alcohol use. No history on file for drug use.  ROS: Pertinent ROS in HPI  Physical Exam: BP 133/82   Pulse 78   Ht '5\' 6"'$  (1.676 m)   Wt 156 lb (70.8 kg)   BMI 25.18 kg/m   Constitutional:  Well nourished. Alert and oriented, No acute distress. HEENT: [place] AT, moist mucus membranes.  Trachea midline Cardiovascular: No clubbing, cyanosis, or edema. Respiratory: Normal respiratory effort, no increased work of breathing. Neurologic: Grossly intact, no focal deficits, moving all 4 extremities. Psychiatric: Normal mood and affect.    Laboratory Data: Uric acid (11/2022)  Serum creatinine (10/2022) 0.8 Urinalysis  See EPIC and HPI I have reviewed the labs.   Pertinent Imaging: N/A  Assessment & Plan:    1. Dysuria -resolved  -UA benign  -urine sent for culture in case symptoms return  2. Renal mass -Status post partial nephrectomy (2017) -RUS pending    Return for pending urine culture and RUS results .  These notes  generated with voice recognition software. I apologize for typographical errors.  [place], [place] [address]  [place], [place] [address] [phone]

## 2023-02-01 ENCOUNTER — Other Ambulatory Visit
Admission: RE | Admit: 2023-02-01 | Discharge: 2023-02-01 | Disposition: A | Discharge status: Home / Self Care | Payer: Medicare Other | Attending: Urology | Admitting: Urology

## 2023-02-01 ENCOUNTER — Encounter: Payer: Self-pay | Admitting: Urology

## 2023-02-01 ENCOUNTER — Other Ambulatory Visit: Payer: Self-pay | Admitting: Family Medicine

## 2023-02-01 ENCOUNTER — Ambulatory Visit: Payer: Medicare Other | Admitting: Urology

## 2023-02-01 VITALS — BP 133/82 | HR 78 | Ht 66.0 in | Wt 156.0 lb

## 2023-02-01 DIAGNOSIS — Z87440 Personal history of urinary (tract) infections: Secondary | ICD-10-CM | POA: Insufficient documentation

## 2023-02-01 DIAGNOSIS — Z87448 Personal history of other diseases of urinary system: Secondary | ICD-10-CM | POA: Diagnosis not present

## 2023-02-01 DIAGNOSIS — R300 Dysuria: Secondary | ICD-10-CM

## 2023-02-01 DIAGNOSIS — N2889 Other specified disorders of kidney and ureter: Secondary | ICD-10-CM

## 2023-02-01 LAB — URINALYSIS, COMPLETE (UACMP) WITH MICROSCOPIC
Bilirubin Urine: NEGATIVE
Glucose, UA: NEGATIVE mg/dL
Hgb urine dipstick: NEGATIVE
Ketones, ur: NEGATIVE mg/dL
Leukocytes,Ua: NEGATIVE
Nitrite: NEGATIVE
Protein, ur: NEGATIVE mg/dL
RBC / HPF: NONE SEEN RBC/hpf (ref 0–5)
Specific Gravity, Urine: 1.01 (ref 1.005–1.030)
pH: 6 (ref 5.0–8.0)

## 2023-02-02 LAB — URINE CULTURE: Culture: NO GROWTH

## 2023-02-03 ENCOUNTER — Telehealth: Payer: Self-pay | Admitting: *Deleted

## 2023-02-03 NOTE — Telephone Encounter (Signed)
Notified patient as instructed, patient pleased °

## 2023-02-03 NOTE — Telephone Encounter (Signed)
-----   Message from Nori Riis, PA-C sent at 02/02/2023  8:08 PM EST ----- Please let [name] know that her urine culture was negative.

## 2023-02-18 ENCOUNTER — Ambulatory Visit
Admission: RE | Admit: 2023-02-18 | Discharge: 2023-02-18 | Disposition: A | Discharge status: Home / Self Care | Payer: Medicare Other | Source: Ambulatory Visit | Attending: Urology | Admitting: Urology

## 2023-02-18 DIAGNOSIS — N2889 Other specified disorders of kidney and ureter: Secondary | ICD-10-CM | POA: Diagnosis not present

## 2023-02-18 DIAGNOSIS — N281 Cyst of kidney, acquired: Secondary | ICD-10-CM | POA: Diagnosis not present

## 2023-02-19 ENCOUNTER — Telehealth: Payer: Self-pay | Admitting: Family Medicine

## 2023-02-19 NOTE — Telephone Encounter (Signed)
-----   Message from Nori Riis, PA-C sent at 02/18/2023  4:46 PM EDT ----- Please let [name] know that her ultrasound of her kidney did not show any worrisome lesions.  We will see her in June.

## 2023-02-19 NOTE — Telephone Encounter (Signed)
Patient notified and voiced understanding.

## 2023-03-17 DIAGNOSIS — Z96652 Presence of left artificial knee joint: Secondary | ICD-10-CM | POA: Diagnosis not present

## 2023-04-07 DIAGNOSIS — B379 Candidiasis, unspecified: Secondary | ICD-10-CM | POA: Diagnosis not present

## 2023-04-07 DIAGNOSIS — T3695XA Adverse effect of unspecified systemic antibiotic, initial encounter: Secondary | ICD-10-CM | POA: Diagnosis not present

## 2023-04-07 DIAGNOSIS — H6692 Otitis media, unspecified, left ear: Secondary | ICD-10-CM | POA: Diagnosis not present

## 2023-04-19 DIAGNOSIS — E039 Hypothyroidism, unspecified: Secondary | ICD-10-CM | POA: Diagnosis not present

## 2023-04-19 DIAGNOSIS — I10 Essential (primary) hypertension: Secondary | ICD-10-CM | POA: Diagnosis not present

## 2023-04-19 DIAGNOSIS — K5100 Ulcerative (chronic) pancolitis without complications: Secondary | ICD-10-CM | POA: Diagnosis not present

## 2023-04-19 DIAGNOSIS — R739 Hyperglycemia, unspecified: Secondary | ICD-10-CM | POA: Diagnosis not present

## 2023-04-19 DIAGNOSIS — J479 Bronchiectasis, uncomplicated: Secondary | ICD-10-CM | POA: Diagnosis not present

## 2023-04-27 DIAGNOSIS — I341 Nonrheumatic mitral (valve) prolapse: Secondary | ICD-10-CM | POA: Diagnosis not present

## 2023-04-27 DIAGNOSIS — I10 Essential (primary) hypertension: Secondary | ICD-10-CM | POA: Diagnosis not present

## 2023-04-27 DIAGNOSIS — R002 Palpitations: Secondary | ICD-10-CM | POA: Diagnosis not present

## 2023-05-04 DIAGNOSIS — I341 Nonrheumatic mitral (valve) prolapse: Secondary | ICD-10-CM | POA: Diagnosis not present

## 2023-05-06 ENCOUNTER — Ambulatory Visit: Payer: Medicare Other | Admitting: Physician Assistant

## 2023-05-06 DIAGNOSIS — N952 Postmenopausal atrophic vaginitis: Secondary | ICD-10-CM | POA: Diagnosis not present

## 2023-05-06 DIAGNOSIS — Z79890 Hormone replacement therapy (postmenopausal): Secondary | ICD-10-CM | POA: Diagnosis not present

## 2023-05-06 DIAGNOSIS — Z124 Encounter for screening for malignant neoplasm of cervix: Secondary | ICD-10-CM | POA: Diagnosis not present

## 2023-05-06 DIAGNOSIS — Z1231 Encounter for screening mammogram for malignant neoplasm of breast: Secondary | ICD-10-CM | POA: Diagnosis not present

## 2023-05-07 DIAGNOSIS — Z86010 Personal history of colonic polyps: Secondary | ICD-10-CM | POA: Diagnosis not present

## 2023-05-07 DIAGNOSIS — K51018 Ulcerative (chronic) pancolitis with other complication: Secondary | ICD-10-CM | POA: Diagnosis not present

## 2023-05-07 DIAGNOSIS — Z800 Family history of malignant neoplasm of digestive organs: Secondary | ICD-10-CM | POA: Diagnosis not present

## 2023-05-07 DIAGNOSIS — K219 Gastro-esophageal reflux disease without esophagitis: Secondary | ICD-10-CM | POA: Diagnosis not present

## 2023-05-07 DIAGNOSIS — Z79899 Other long term (current) drug therapy: Secondary | ICD-10-CM | POA: Diagnosis not present

## 2023-05-10 DIAGNOSIS — R918 Other nonspecific abnormal finding of lung field: Secondary | ICD-10-CM | POA: Diagnosis not present

## 2023-05-10 DIAGNOSIS — R911 Solitary pulmonary nodule: Secondary | ICD-10-CM | POA: Diagnosis not present

## 2023-05-10 DIAGNOSIS — R0602 Shortness of breath: Secondary | ICD-10-CM | POA: Diagnosis not present

## 2023-05-10 DIAGNOSIS — J479 Bronchiectasis, uncomplicated: Secondary | ICD-10-CM | POA: Diagnosis not present

## 2023-05-10 DIAGNOSIS — Z7969 Long term (current) use of other immunomodulators and immunosuppressants: Secondary | ICD-10-CM | POA: Diagnosis not present

## 2023-05-27 DIAGNOSIS — R002 Palpitations: Secondary | ICD-10-CM | POA: Diagnosis not present

## 2023-06-01 DIAGNOSIS — K51018 Ulcerative (chronic) pancolitis with other complication: Secondary | ICD-10-CM | POA: Diagnosis not present

## 2023-06-01 DIAGNOSIS — Z79899 Other long term (current) drug therapy: Secondary | ICD-10-CM | POA: Diagnosis not present

## 2023-06-09 DIAGNOSIS — R002 Palpitations: Secondary | ICD-10-CM | POA: Diagnosis not present

## 2023-06-14 DIAGNOSIS — R197 Diarrhea, unspecified: Secondary | ICD-10-CM | POA: Diagnosis not present

## 2023-06-15 DIAGNOSIS — Z79899 Other long term (current) drug therapy: Secondary | ICD-10-CM | POA: Diagnosis not present

## 2023-06-15 DIAGNOSIS — K51018 Ulcerative (chronic) pancolitis with other complication: Secondary | ICD-10-CM | POA: Diagnosis not present

## 2023-07-22 DIAGNOSIS — E039 Hypothyroidism, unspecified: Secondary | ICD-10-CM | POA: Diagnosis not present

## 2023-08-03 DIAGNOSIS — E039 Hypothyroidism, unspecified: Secondary | ICD-10-CM | POA: Diagnosis not present

## 2023-08-05 DIAGNOSIS — N6320 Unspecified lump in the left breast, unspecified quadrant: Secondary | ICD-10-CM | POA: Diagnosis not present

## 2023-08-05 DIAGNOSIS — Z1231 Encounter for screening mammogram for malignant neoplasm of breast: Secondary | ICD-10-CM | POA: Diagnosis not present

## 2023-08-05 DIAGNOSIS — N6310 Unspecified lump in the right breast, unspecified quadrant: Secondary | ICD-10-CM | POA: Diagnosis not present

## 2023-08-08 DIAGNOSIS — R300 Dysuria: Secondary | ICD-10-CM | POA: Diagnosis not present

## 2023-08-10 DIAGNOSIS — Z79890 Hormone replacement therapy (postmenopausal): Secondary | ICD-10-CM | POA: Diagnosis not present

## 2023-08-10 DIAGNOSIS — K529 Noninfective gastroenteritis and colitis, unspecified: Secondary | ICD-10-CM | POA: Diagnosis not present

## 2023-08-10 DIAGNOSIS — D509 Iron deficiency anemia, unspecified: Secondary | ICD-10-CM | POA: Diagnosis not present

## 2023-08-10 DIAGNOSIS — K51018 Ulcerative (chronic) pancolitis with other complication: Secondary | ICD-10-CM | POA: Diagnosis not present

## 2023-08-10 DIAGNOSIS — D631 Anemia in chronic kidney disease: Secondary | ICD-10-CM | POA: Diagnosis not present

## 2023-08-10 DIAGNOSIS — K219 Gastro-esophageal reflux disease without esophagitis: Secondary | ICD-10-CM | POA: Diagnosis not present

## 2023-08-10 DIAGNOSIS — E039 Hypothyroidism, unspecified: Secondary | ICD-10-CM | POA: Diagnosis not present

## 2023-08-10 DIAGNOSIS — K6389 Other specified diseases of intestine: Secondary | ICD-10-CM | POA: Diagnosis not present

## 2023-08-10 DIAGNOSIS — Z9049 Acquired absence of other specified parts of digestive tract: Secondary | ICD-10-CM | POA: Diagnosis not present

## 2023-08-10 DIAGNOSIS — N189 Chronic kidney disease, unspecified: Secondary | ICD-10-CM | POA: Diagnosis not present

## 2023-08-10 DIAGNOSIS — Z87891 Personal history of nicotine dependence: Secondary | ICD-10-CM | POA: Diagnosis not present

## 2023-08-10 DIAGNOSIS — K648 Other hemorrhoids: Secondary | ICD-10-CM | POA: Diagnosis not present

## 2023-08-10 DIAGNOSIS — D122 Benign neoplasm of ascending colon: Secondary | ICD-10-CM | POA: Diagnosis not present

## 2023-08-10 DIAGNOSIS — D120 Benign neoplasm of cecum: Secondary | ICD-10-CM | POA: Diagnosis not present

## 2023-08-10 DIAGNOSIS — D123 Benign neoplasm of transverse colon: Secondary | ICD-10-CM | POA: Diagnosis not present

## 2023-08-10 DIAGNOSIS — K635 Polyp of colon: Secondary | ICD-10-CM | POA: Diagnosis not present

## 2023-08-10 DIAGNOSIS — Z79899 Other long term (current) drug therapy: Secondary | ICD-10-CM | POA: Diagnosis not present

## 2023-08-10 DIAGNOSIS — I129 Hypertensive chronic kidney disease with stage 1 through stage 4 chronic kidney disease, or unspecified chronic kidney disease: Secondary | ICD-10-CM | POA: Diagnosis not present

## 2023-08-10 DIAGNOSIS — K5100 Ulcerative (chronic) pancolitis without complications: Secondary | ICD-10-CM | POA: Diagnosis not present

## 2023-09-03 DIAGNOSIS — Z96652 Presence of left artificial knee joint: Secondary | ICD-10-CM | POA: Diagnosis not present

## 2023-09-03 DIAGNOSIS — Z471 Aftercare following joint replacement surgery: Secondary | ICD-10-CM | POA: Diagnosis not present

## 2023-09-03 DIAGNOSIS — R911 Solitary pulmonary nodule: Secondary | ICD-10-CM | POA: Diagnosis not present

## 2023-09-03 DIAGNOSIS — S83242D Other tear of medial meniscus, current injury, left knee, subsequent encounter: Secondary | ICD-10-CM | POA: Diagnosis not present

## 2023-09-03 DIAGNOSIS — K51919 Ulcerative colitis, unspecified with unspecified complications: Secondary | ICD-10-CM | POA: Diagnosis not present

## 2023-09-13 DIAGNOSIS — E039 Hypothyroidism, unspecified: Secondary | ICD-10-CM | POA: Diagnosis not present

## 2023-10-25 DIAGNOSIS — Z1331 Encounter for screening for depression: Secondary | ICD-10-CM | POA: Diagnosis not present

## 2023-10-25 DIAGNOSIS — Z23 Encounter for immunization: Secondary | ICD-10-CM | POA: Diagnosis not present

## 2023-10-25 DIAGNOSIS — Z0000 Encounter for general adult medical examination without abnormal findings: Secondary | ICD-10-CM | POA: Diagnosis not present

## 2023-10-25 DIAGNOSIS — E039 Hypothyroidism, unspecified: Secondary | ICD-10-CM | POA: Diagnosis not present

## 2023-10-25 DIAGNOSIS — R739 Hyperglycemia, unspecified: Secondary | ICD-10-CM | POA: Diagnosis not present

## 2023-10-25 DIAGNOSIS — Z780 Asymptomatic menopausal state: Secondary | ICD-10-CM | POA: Diagnosis not present

## 2023-10-25 DIAGNOSIS — I10 Essential (primary) hypertension: Secondary | ICD-10-CM | POA: Diagnosis not present

## 2023-11-16 DIAGNOSIS — Z9049 Acquired absence of other specified parts of digestive tract: Secondary | ICD-10-CM | POA: Diagnosis not present

## 2023-11-16 DIAGNOSIS — Z79899 Other long term (current) drug therapy: Secondary | ICD-10-CM | POA: Diagnosis not present

## 2023-11-16 DIAGNOSIS — N189 Chronic kidney disease, unspecified: Secondary | ICD-10-CM | POA: Diagnosis not present

## 2023-11-16 DIAGNOSIS — E039 Hypothyroidism, unspecified: Secondary | ICD-10-CM | POA: Diagnosis not present

## 2023-11-16 DIAGNOSIS — D631 Anemia in chronic kidney disease: Secondary | ICD-10-CM | POA: Diagnosis not present

## 2023-11-16 DIAGNOSIS — K5190 Ulcerative colitis, unspecified, without complications: Secondary | ICD-10-CM | POA: Diagnosis not present

## 2023-11-16 DIAGNOSIS — K529 Noninfective gastroenteritis and colitis, unspecified: Secondary | ICD-10-CM | POA: Diagnosis not present

## 2023-11-16 DIAGNOSIS — K5730 Diverticulosis of large intestine without perforation or abscess without bleeding: Secondary | ICD-10-CM | POA: Diagnosis not present

## 2023-11-16 DIAGNOSIS — K621 Rectal polyp: Secondary | ICD-10-CM | POA: Diagnosis not present

## 2023-11-16 DIAGNOSIS — D123 Benign neoplasm of transverse colon: Secondary | ICD-10-CM | POA: Diagnosis not present

## 2023-11-16 DIAGNOSIS — K219 Gastro-esophageal reflux disease without esophagitis: Secondary | ICD-10-CM | POA: Diagnosis not present

## 2023-11-16 DIAGNOSIS — K5100 Ulcerative (chronic) pancolitis without complications: Secondary | ICD-10-CM | POA: Diagnosis not present

## 2023-11-16 DIAGNOSIS — I129 Hypertensive chronic kidney disease with stage 1 through stage 4 chronic kidney disease, or unspecified chronic kidney disease: Secondary | ICD-10-CM | POA: Diagnosis not present

## 2023-11-16 DIAGNOSIS — K6289 Other specified diseases of anus and rectum: Secondary | ICD-10-CM | POA: Diagnosis not present

## 2023-11-16 DIAGNOSIS — D122 Benign neoplasm of ascending colon: Secondary | ICD-10-CM | POA: Diagnosis not present

## 2023-12-17 DIAGNOSIS — Z860101 Personal history of adenomatous and serrated colon polyps: Secondary | ICD-10-CM | POA: Diagnosis not present

## 2023-12-17 DIAGNOSIS — K51018 Ulcerative (chronic) pancolitis with other complication: Secondary | ICD-10-CM | POA: Diagnosis not present

## 2023-12-17 DIAGNOSIS — K219 Gastro-esophageal reflux disease without esophagitis: Secondary | ICD-10-CM | POA: Diagnosis not present

## 2023-12-17 DIAGNOSIS — K580 Irritable bowel syndrome with diarrhea: Secondary | ICD-10-CM | POA: Diagnosis not present

## 2023-12-17 DIAGNOSIS — Z79899 Other long term (current) drug therapy: Secondary | ICD-10-CM | POA: Diagnosis not present

## 2023-12-18 DIAGNOSIS — K580 Irritable bowel syndrome with diarrhea: Secondary | ICD-10-CM | POA: Insufficient documentation

## 2023-12-21 DIAGNOSIS — I341 Nonrheumatic mitral (valve) prolapse: Secondary | ICD-10-CM | POA: Diagnosis not present

## 2024-01-22 DIAGNOSIS — K5100 Ulcerative (chronic) pancolitis without complications: Secondary | ICD-10-CM | POA: Diagnosis not present

## 2024-02-01 DIAGNOSIS — E039 Hypothyroidism, unspecified: Secondary | ICD-10-CM | POA: Diagnosis not present

## 2024-02-15 DIAGNOSIS — I059 Rheumatic mitral valve disease, unspecified: Secondary | ICD-10-CM | POA: Diagnosis not present

## 2024-02-15 DIAGNOSIS — E039 Hypothyroidism, unspecified: Secondary | ICD-10-CM | POA: Diagnosis not present

## 2024-03-06 ENCOUNTER — Encounter: Payer: Self-pay | Admitting: Podiatry

## 2024-03-06 ENCOUNTER — Ambulatory Visit: Payer: No Typology Code available for payment source | Admitting: Podiatry

## 2024-03-06 DIAGNOSIS — D649 Anemia, unspecified: Secondary | ICD-10-CM | POA: Insufficient documentation

## 2024-03-06 DIAGNOSIS — L603 Nail dystrophy: Secondary | ICD-10-CM

## 2024-03-06 DIAGNOSIS — L608 Other nail disorders: Secondary | ICD-10-CM | POA: Diagnosis not present

## 2024-03-06 NOTE — Progress Notes (Signed)
 Subjective:  Patient ID: [name], female    DOB: June 09, 1956,  MRN: [id number] HPI Chief Complaint  Patient presents with   Nail Problem    Hallux left - discoloration x months, toenail fell off last night and ripped down the medial border, tried OTC meds-no help   New Patient (Initial Visit)    Est pt - did her bunion surgery years ago    68 y.o. female presents with the above complaint.   ROS: Denies fever chills nausea vomit muscle aches pains calf pain back pain chest pain shortness of breath.  Past Medical History:  Diagnosis Date   Colon polyps 2004   Goiter    dx in her 30s   No past surgical history on file.  Current Outpatient Medications:    dicyclomine (BENTYL) 10 MG capsule, Take by mouth., Disp: , Rfl:    gabapentin (NEURONTIN) 100 MG capsule, Take 100 mg by mouth 3 (three) times daily., Disp: , Rfl:    albuterol (VENTOLIN HFA) 108 (90 Base) MCG/ACT inhaler, Inhale into the lungs., Disp: , Rfl:    amoxicillin (AMOXIL) 500 MG capsule, Take 500 mg by mouth 3 (three) times daily., Disp: , Rfl:    carvedilol (COREG) 12.5 MG tablet, Take 12.5 mg by mouth 2 (two) times daily., Disp: , Rfl:    desipramine (NORPRAMIN) 25 MG tablet, Take by mouth., Disp: , Rfl:    estradiol (ESTRACE) 0.1 MG/GM vaginal cream, Place vaginally., Disp: , Rfl:    ferrous sulfate 325 (65 FE) MG tablet, Take 325 mg by mouth 2 (two) times daily., Disp: , Rfl:    levothyroxine (SYNTHROID) 112 MCG tablet, Take 112 mcg by mouth daily., Disp: , Rfl:    Multiple Vitamin (MULTI-VITAMIN) tablet, Take 1 tablet by mouth daily., Disp: , Rfl:    omeprazole (PRILOSEC) 40 MG capsule, Take 40 mg by mouth daily., Disp: , Rfl:    predniSONE (DELTASONE) 10 MG tablet, Take by mouth., Disp: , Rfl:    STELARA 45 MG/0.5ML SOLN, Inject into the skin., Disp: , Rfl:   Allergies  Allergen Reactions   Codeine Itching and Nausea Only   Hydromorphone Other (See Comments) and Shortness Of Breath   Infliximab Anaphylaxis  and Other (See Comments)   Mesalamine Nausea Only    Other reaction(s): Abdominal Pain   Sulfa Antibiotics Itching   Oxycodone Nausea And Vomiting   Vedolizumab Other (See Comments)    Developed antibodies; had UC flare   Nisoldipine Rash   Review of Systems Objective:  There were no vitals filed for this visit.  General: Well developed, nourished, in no acute distress, alert and oriented x3   Dermatological: Skin is warm, dry and supple bilateral. Nails x 10 are well maintained; remaining integument appears unremarkable at this time. There are no open sores, no preulcerative lesions, no rash or signs of infection present.  Hallux nail left foot is discolored with distal onycholysis.  Vascular: Dorsalis Pedis artery and Posterior Tibial artery pedal pulses are 2/4 bilateral with immedate capillary fill time. Pedal hair growth present. No varicosities and no lower extremity edema present bilateral.   Neruologic: Grossly intact via light touch bilateral. Vibratory intact via tuning fork bilateral. Protective threshold with Semmes Wienstein monofilament intact to all pedal sites bilateral. Patellar and Achilles deep tendon reflexes 2+ bilateral. No Babinski or clonus noted bilateral.   Musculoskeletal: No gross boney pedal deformities bilateral. No pain, crepitus, or limitation noted with foot and ankle range of motion bilateral. Muscular strength  5/5 in all groups tested bilateral.  Gait: Unassisted, Nonantalgic.    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  Plan: Sample of the nail was taken today there was very little to biopsy hopefully this will be enough to confirm dystrophy or fungus.     Emara Lichter T. [place], [place]

## 2024-04-03 ENCOUNTER — Ambulatory Visit: Admitting: Podiatry

## 2024-04-05 ENCOUNTER — Ambulatory Visit: Admitting: Podiatry

## 2024-04-05 ENCOUNTER — Encounter: Payer: Self-pay | Admitting: Podiatry

## 2024-04-05 DIAGNOSIS — K52831 Collagenous colitis: Secondary | ICD-10-CM | POA: Insufficient documentation

## 2024-04-05 DIAGNOSIS — L603 Nail dystrophy: Secondary | ICD-10-CM

## 2024-04-05 DIAGNOSIS — D500 Iron deficiency anemia secondary to blood loss (chronic): Secondary | ICD-10-CM | POA: Insufficient documentation

## 2024-04-05 DIAGNOSIS — Z800 Family history of malignant neoplasm of digestive organs: Secondary | ICD-10-CM | POA: Insufficient documentation

## 2024-04-05 DIAGNOSIS — R1032 Left lower quadrant pain: Secondary | ICD-10-CM | POA: Insufficient documentation

## 2024-04-05 DIAGNOSIS — R109 Unspecified abdominal pain: Secondary | ICD-10-CM | POA: Insufficient documentation

## 2024-04-05 DIAGNOSIS — R197 Diarrhea, unspecified: Secondary | ICD-10-CM | POA: Insufficient documentation

## 2024-04-05 MED ORDER — NEOMYCIN-POLYMYXIN-HC 1 % OT SOLN: OTIC | 1 refills | Status: DC

## 2024-04-05 NOTE — Progress Notes (Signed)
 She presents today for follow-up of her nail pathology.  Objective: Nail pathology does demonstrate pathologic hyperkeratosis beneath the nail.  With bacterial colonization.  Assessment: Bacterial colonization hallux left.  Plan: Most likely a nail dystrophy will grow out very slowly but we will prescribe Cortisporin otic.  I will follow-up with her on an as-needed basis.

## 2024-05-01 DIAGNOSIS — I10 Essential (primary) hypertension: Secondary | ICD-10-CM | POA: Diagnosis not present

## 2024-05-01 DIAGNOSIS — R739 Hyperglycemia, unspecified: Secondary | ICD-10-CM | POA: Diagnosis not present

## 2024-05-01 DIAGNOSIS — Z79890 Hormone replacement therapy (postmenopausal): Secondary | ICD-10-CM | POA: Diagnosis not present

## 2024-05-01 DIAGNOSIS — I341 Nonrheumatic mitral (valve) prolapse: Secondary | ICD-10-CM | POA: Diagnosis not present

## 2024-05-01 DIAGNOSIS — Z23 Encounter for immunization: Secondary | ICD-10-CM | POA: Diagnosis not present

## 2024-05-01 DIAGNOSIS — E039 Hypothyroidism, unspecified: Secondary | ICD-10-CM | POA: Diagnosis not present

## 2024-05-01 DIAGNOSIS — K219 Gastro-esophageal reflux disease without esophagitis: Secondary | ICD-10-CM | POA: Diagnosis not present

## 2024-05-01 DIAGNOSIS — Z85528 Personal history of other malignant neoplasm of kidney: Secondary | ICD-10-CM | POA: Diagnosis not present

## 2024-05-01 DIAGNOSIS — D649 Anemia, unspecified: Secondary | ICD-10-CM | POA: Diagnosis not present

## 2024-05-01 DIAGNOSIS — K5190 Ulcerative colitis, unspecified, without complications: Secondary | ICD-10-CM | POA: Diagnosis not present

## 2024-05-01 DIAGNOSIS — D500 Iron deficiency anemia secondary to blood loss (chronic): Secondary | ICD-10-CM | POA: Diagnosis not present

## 2024-05-01 DIAGNOSIS — J479 Bronchiectasis, uncomplicated: Secondary | ICD-10-CM | POA: Diagnosis not present

## 2024-05-01 DIAGNOSIS — Z905 Acquired absence of kidney: Secondary | ICD-10-CM | POA: Diagnosis not present

## 2024-05-01 DIAGNOSIS — Z79899 Other long term (current) drug therapy: Secondary | ICD-10-CM | POA: Diagnosis not present

## 2024-05-08 ENCOUNTER — Encounter: Payer: Self-pay | Admitting: Dermatology

## 2024-05-08 ENCOUNTER — Ambulatory Visit: Admitting: Dermatology

## 2024-05-08 DIAGNOSIS — L03211 Cellulitis of face: Secondary | ICD-10-CM | POA: Diagnosis not present

## 2024-05-08 DIAGNOSIS — L729 Follicular cyst of the skin and subcutaneous tissue, unspecified: Secondary | ICD-10-CM

## 2024-05-08 DIAGNOSIS — L720 Epidermal cyst: Secondary | ICD-10-CM

## 2024-05-08 DIAGNOSIS — L821 Other seborrheic keratosis: Secondary | ICD-10-CM

## 2024-05-08 DIAGNOSIS — L0201 Cutaneous abscess of face: Secondary | ICD-10-CM

## 2024-05-08 DIAGNOSIS — Z7189 Other specified counseling: Secondary | ICD-10-CM

## 2024-05-08 DIAGNOSIS — Z79899 Other long term (current) drug therapy: Secondary | ICD-10-CM

## 2024-05-08 MED ORDER — MUPIROCIN 2 % EX OINT: TOPICAL_OINTMENT | CUTANEOUS | 1 refills | Status: AC

## 2024-05-08 MED ORDER — DOXYCYCLINE HYCLATE 100 MG PO TABS: ORAL_TABLET | ORAL | 1 refills | Status: DC

## 2024-05-08 NOTE — Patient Instructions (Signed)

## 2024-05-08 NOTE — Progress Notes (Signed)
   Follow-Up Visit   Subjective  [name] is a 68 y.o. female who presents for the following: Check a spot on her left cheek that appeared several months ago, patient squeezed this area gently yesterday and a lot of pus came out, so area is smaller today. The patient has spots, moles and lesions to be evaluated, some may be new or changing and the patient may have concern these could be cancer.  The following portions of the chart were reviewed this encounter and updated as appropriate: medications, allergies, medical history  Review of Systems:  No other skin or systemic complaints except as noted in HPI or Assessment and Plan.  Objective  Well appearing patient in no apparent distress; mood and affect are within normal limits.  A focused examination was performed of the following areas: face  Relevant exam findings are noted in the Assessment and Plan.         Assessment & Plan   Ruptured Inflamed EPIDERMAL INCLUSION CYST with cellulitis and abscess  - already drained Exam: erythematous patch- see photo    Benign-appearing. Exam most consistent with an epidermal inclusion cyst. Discussed that a cyst is a benign growth that can grow over time and sometimes get irritated or inflamed. Recommend observation if it is not bothersome. Discussed option of surgical excision to remove it if it is growing, symptomatic, or other changes noted. Please call for new or changing lesions so they can be evaluated.  Start Doxycycline 100 mg take 1 tablet po bid with food x 7 days  Doxycycline should be taken with food to prevent nausea. Do not lay down for 30 minutes after taking. Be cautious with sun exposure and use good sun protection while on this medication. Pregnant women should not take this medication.   Start Mupirocin ointment apply tid   Patient instructed to rub gently daily to make sure the sac is completely out.   Discussed potential for persistent or recurrent  cyst.  SEBORRHEIC KERATOSIS Right cheek- see photo  - Stuck-on, waxy, tan-brown papules and/or plaques  - Benign-appearing - Discussed benign etiology and prognosis. - Observe - Call for any changes  Plan on treating in 1 month with LN2 Or may use topical diclofenac (Votaren cream)  SEBORRHEIC KERATOSIS   CYST OF SKIN   CELLULITIS OF FACE   COUNSELING AND COORDINATION OF CARE   MEDICATION MANAGEMENT    Return in about 4 weeks (around 06/05/2024) for recheck right cheek .  IClara Crisp, CMA, am acting as scribe for Celine Collard, MD .   Documentation: I have reviewed the above documentation for accuracy and completeness, and I agree with the above.  Celine Collard, MD

## 2024-05-10 DIAGNOSIS — K5100 Ulcerative (chronic) pancolitis without complications: Secondary | ICD-10-CM | POA: Diagnosis not present

## 2024-06-08 ENCOUNTER — Ambulatory Visit: Admitting: Dermatology

## 2024-06-08 ENCOUNTER — Encounter: Payer: Self-pay | Admitting: Dermatology

## 2024-06-08 DIAGNOSIS — W908XXA Exposure to other nonionizing radiation, initial encounter: Secondary | ICD-10-CM | POA: Diagnosis not present

## 2024-06-08 DIAGNOSIS — L820 Inflamed seborrheic keratosis: Secondary | ICD-10-CM

## 2024-06-08 DIAGNOSIS — L539 Erythematous condition, unspecified: Secondary | ICD-10-CM | POA: Diagnosis not present

## 2024-06-08 DIAGNOSIS — L578 Other skin changes due to chronic exposure to nonionizing radiation: Secondary | ICD-10-CM | POA: Diagnosis not present

## 2024-06-08 DIAGNOSIS — L729 Follicular cyst of the skin and subcutaneous tissue, unspecified: Secondary | ICD-10-CM

## 2024-06-08 DIAGNOSIS — Z872 Personal history of diseases of the skin and subcutaneous tissue: Secondary | ICD-10-CM

## 2024-06-08 DIAGNOSIS — L821 Other seborrheic keratosis: Secondary | ICD-10-CM | POA: Diagnosis not present

## 2024-06-08 NOTE — Progress Notes (Signed)
   Follow-Up Visit   Subjective  Danay Mckellar is a 68 y.o. female who presents for the following: hx of sk at right cheek that patient is bothered by and would like treated, hx of cyst at left cheek.   The following portions of the chart were reviewed this encounter and updated as appropriate: medications, allergies, medical history  Review of Systems:  No other skin or systemic complaints except as noted in HPI or Assessment and Plan.  Objective  Well appearing patient in no apparent distress; mood and affect are within normal limits.   A focused examination was performed of the following areas: Right cheek  and left cheek   Relevant exam findings are noted in the Assessment and Plan.  right cheek x 1 Erythematous stuck-on, waxy papule or plaque   Assessment & Plan   SEBORRHEIC KERATOSIS - Stuck-on, waxy, tan-brown papules and/or plaques  - Benign-appearing - Discussed benign etiology and prognosis. - Observe - Call for any changes  ACTINIC DAMAGE - chronic, secondary to cumulative UV radiation exposure/sun exposure over time - diffuse scaly erythematous macules with underlying dyspigmentation - Recommend daily broad spectrum sunscreen SPF 30+ to sun-exposed areas, reapply every 2 hours as needed.  - Recommend staying in the shade or wearing long sleeves, sun glasses (UVA+UVB protection) and wide brim hats (4-inch brim around the entire circumference of the hat). - Call for new or changing lesions.   History of Ruptured Inflamed EPIDERMAL INCLUSION CYST with cellulitis and abscess  - drained in the past  Exam: erythematous patch- see photo   Benign-appearing.  Do not recommend any treatment today Advised this may recur and may need treatment in the future.  He understands.   INFLAMED SEBORRHEIC KERATOSIS right cheek x 1 Symptomatic, irritating, patient would like treated. Destruction of lesion - right cheek x 1 Complexity: simple   Destruction method: cryotherapy    Informed consent: discussed and consent obtained   Timeout:  patient name, date of birth, surgical site, and procedure verified Lesion destroyed using liquid nitrogen: Yes   Region frozen until ice ball extended beyond lesion: Yes   Outcome: patient tolerated procedure well with no complications   Post-procedure details: wound care instructions given     Return in about 6 months (around 12/09/2024) for isk follow up.  IEleanor Blush, CMA, am acting as scribe for Alm Rhyme, MD.   Documentation: I have reviewed the above documentation for accuracy and completeness, and I agree with the above.  Alm Rhyme, MD

## 2024-06-08 NOTE — Patient Instructions (Addendum)

## 2024-06-09 DIAGNOSIS — Z79890 Hormone replacement therapy (postmenopausal): Secondary | ICD-10-CM | POA: Diagnosis not present

## 2024-06-09 DIAGNOSIS — N952 Postmenopausal atrophic vaginitis: Secondary | ICD-10-CM | POA: Diagnosis not present

## 2024-06-09 DIAGNOSIS — Z1231 Encounter for screening mammogram for malignant neoplasm of breast: Secondary | ICD-10-CM | POA: Diagnosis not present

## 2024-06-26 DIAGNOSIS — I3139 Other pericardial effusion (noninflammatory): Secondary | ICD-10-CM | POA: Diagnosis not present

## 2024-06-26 DIAGNOSIS — J479 Bronchiectasis, uncomplicated: Secondary | ICD-10-CM | POA: Diagnosis not present

## 2024-06-26 DIAGNOSIS — R918 Other nonspecific abnormal finding of lung field: Secondary | ICD-10-CM | POA: Diagnosis not present

## 2024-06-26 NOTE — Progress Notes (Signed)
 Pulmonary Problem List 1.  Lung nodules; Tree-in-bud.  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 female who presents for evaluation of an abnormal CT scan.  Fall, 2019 she developed what she thought might be a viral syndrome.  This was followed by pain in her neck as well as in her chest.  Over the course of couple weeks the chest pain became more annoying and she eventually presented to the emergency room at [facility].  The note from that visit indicates intermittent shortness of breath as well as chest discomfort.  She was sent for a CT scan to rule out pulmonary embolism.  This was negative, however there were radiographic abnormalities noted in the lingula as well as in the right upper lobe.  A follow-up CT was recommended.  Subsequent CTs have demonstrated some waxing and waning lung nodules.  Because of the development of new nodules in the spring 2023 she underwent a bronchoscopy for cultures.  Biopsy of the right upper lobe was negative as were cultures.  She is here today for follow-up and to review her most recent CT.  Patient had a brief history of smoking when she was in her 37s.  She estimates a cumulative 13-pack-year history.  She quit when she was around 29.  She does have a relatively strong family history of malignancy.  Her father had had neck cancer.  Her mother had a history of colon cancer as well as leukemia which was eventually fatal.  Her brother has had bladder cancer.  The patient herself has not had a history of cancer.  All of her screening is up-to-date.  Is been in an eventful medical interval.  Inflammatory bowel disease appears to be in remission on her current Stelara  ustekinumab ).  Her steroids have been weaned.  Recent colonoscopy was negative..   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  Most recently she underwent a left knee replacement.  She tolerated  this extremely well.  She use only Tylenol for pain control.  She is not currently having any significant respiratory symptoms, and if anything her cough seems to be improved.  She is not producing any sputum.    Current Medications Current Outpatient Medications  Medication Sig Dispense Refill  . albuterol MDI, PROVENTIL, VENTOLIN, PROAIR, HFA 90 mcg/actuation inhaler Inhale 2 inhalations into the lungs every 4 (four) hours as needed for Shortness of Breath (cough) 6.7 g 0  . ascorbic acid, vitamin C, (ASCORBIC ACID) 250 mg Chew 1 tablet Orally Once a day for 30 day(s)    . carvediloL (COREG) 12.5 MG tablet Take 1 tablet (12.5 mg total) by mouth once daily AND 2 tablets (25 mg total) at bedtime. 270 tablet 3  . cyanocobalamin  (VITAMIN B12) 500 MCG tablet Take 1,000 mcg by mouth once daily    . desipramine (NORPRAMIN) 25 MG tablet Take 1 tablet (25 mg total) by mouth at bedtime 90 tablet 3  . dicyclomine (BENTYL) 10 mg capsule TAKE 1 CAPSULE(10 MG) BY MOUTH FOUR TIMES DAILY BEFORE MEALS AND AT NIGHT 360 capsule 1  . EPINEPHrine  (EPIPEN ) 0.3 mg/0.3 mL auto-injector INJECT 0.3 MLS IN THE MUSCLE ONCE AS NEEDED FOR ANAPHYLAXIS FOR UP TO 1 DOSE    . estradioL (ESTRACE) 0.01 % (0.1 mg/gram) vaginal cream Place 0.5 g vaginally twice a week 42.5 g 1  . FEROSUL 325 mg (65 mg iron) tablet TAKE  1 TABLET BY MOUTH TWICE DAILY WITH MEALS 180 tablet 1  . levothyroxine (SYNTHROID) 100 MCG tablet Take 100 mcg by mouth once daily Take on an empty stomach with a glass of water at least 30-60 minutes before breakfast.    . multivitamin tablet Take 1 tablet by mouth once daily        . omeprazole (PRILOSEC) 40 MG DR capsule TAKE 1 CAPSULE(40 MG) BY MOUTH EVERY DAY 30 capsule 11  . ustekinumab  (STELARA ) 90 mg/mL injection syringe Inject 1 mL (90 mg total) subcutaneously every 8 (eight) weeks 1 mL 3   No current facility-administered medications for this visit.     Physical Exam BP 137/67   Pulse 78   Temp  36.7 C (98.1 F) (Oral)   Resp 18   Ht 170.2 cm (5' 7)   Wt 71.6 kg (157 lb 13.6 oz)   LMP 04/13/2012 Comment: PM  SpO2 100% Comment: ra  BMI 24.72 kg/m Body mass index is 24.72 kg/m. General Appearance:  Alert, cooperative, no distress  HEENT:  EOMI, oral and nasal mucosa normal  Neck: no JVD, no thyromegaly noted  Lungs:    Clear to auscultation bilateral no wheeze or rhonchi  Heart:  Regular rate and rhythm, normal S1 and S2, no murmurs/rubs/gallops  Abdomen:   Soft, non-tender, no masses, no organomegaly  Extremities: Extremities normal, no cyanosis or edema.  Skin: No lower extremity rashes or ulcers  Neurologic: Alert, interactive, and appropriate, grossly moving all 4 extremities    Accessory Data:  Pulmonary function when done last demonstrate borderline obstruction with an FEV1/FVC = 71%.  Lung volumes and diffusion capacity are within normal notes  Radiographic review: Waxing and waning nodules with some areas of tree-in-bud bronchiectasis.  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  Abnormal CT scan: [name] has had waxing and waning infiltrates.  Most recently the left lower lobe nodule appears to be slightly worse.  She clearly has tree-in-bud opacities in the right middle lobe and lingula.  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  [name] is currently on Ustekinumab  which binds to the p40 subunit of interleukin (IL)-12 and IL-23 and prevents their interaction with the cell surface IL-12R1 receptor, subsequently inhibiting IL-12- and IL-23-mediated cell signaling, activation and cytokine production.  Richard suggested that he should have less predisposition to mycobacterial infections as compared to anti-TNF medications.  That said there does seem to be some signal for increased risk of mycobacteria with this medication and certainly her CT scan is very suspicious..  Bronchoscopy 2 years ago  was negative for MAC.  She is currently asymptomatic.  She has sputum cups and if she starts to produce sputum I would like to culture it.  I have asked her to let me know so that I can make sure that the orders are up-to-date.  At this point given her asymptomatic status as well as minimal change on CT scan I do not think that she needs another bronchoscopy however if there is progression over time and she is not able to produce sputum this would certainly be the likely neck step.    Finally if she were to know today that she has MAC she would not treat given relatively stable radiographs and concern for worsening of her inflammatory bowel disease.      If she has evidence of radiographic progression we would consider a repeat bronchoscopy.  Plan: -  Follow-up function test in 6 months -Likely follow-up CT scan in 12 months -No indication for antimicrobial therapy at this time   This note generated with speech recognition software. Please excuse typographical errors which may be present despite attempts to carefully edit  I spent a total of 45 minutes in both face-to-face and non-face-to-face activities for this visit on the date of this encounter.

## 2024-07-04 DIAGNOSIS — K5100 Ulcerative (chronic) pancolitis without complications: Secondary | ICD-10-CM | POA: Diagnosis not present

## 2024-07-24 NOTE — Progress Notes (Unsigned)
 07/25/2024 7:30 PM   [name] 1955-12-06 [id number]  Referring provider: Rojelio Loader, MD [address] [place],  [place] [address]  Urological history: 1. Renal mass -s/p partial nephrectomy at [facility] in 05/2021 for a 1.6 cm right upper pole renal mass. 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 rUTI's -Contributing factors of age and vaginal atrophy -Documented urine cultures over the last year  None  No chief complaint on file.   HPI: [name] is a 68 y.o. female who presents today for follow up.    Previous records reviewed.     Hemoglobin A1c of 6.0 in June.   Serum creatinine of 0.8, with a EGFR of 95 in January of this year.  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   PMH: Past Medical History:  Diagnosis Date   Colon polyps 2004   Goiter    dx in her 30s    Surgical History: No past surgical history on file.  Home Medications:  Allergies as of 07/25/2024       Reactions   Codeine Itching, Nausea Only   Hydromorphone Other (See Comments), Shortness Of Breath   Infliximab  Anaphylaxis, Other (See Comments)   Mesalamine Nausea Only   Other reaction(s): Abdominal Pain   Sulfa Antibiotics Itching   Oxycodone Nausea And Vomiting   Vedolizumab Other (See Comments)   Developed antibodies; had UC flare   Nisoldipine Rash        Medication List        Accurate as of July 24, 2024  7:30 PM. If you have any questions, ask your nurse or doctor.          albuterol 108 (90 Base) MCG/ACT inhaler Commonly known as: VENTOLIN HFA Inhale into the lungs.   amoxicillin 500 MG capsule Commonly known as: AMOXIL Take 500 mg by mouth 3 (three) times daily.   carvedilol 12.5 MG tablet Commonly known as: COREG Take 12.5 mg by mouth 2 (two) times daily.   desipramine 25 MG tablet Commonly known as: NORPRAMIN Take by mouth.    dicyclomine 10 MG capsule Commonly known as: BENTYL Take by mouth.   doxycycline  100 MG tablet Commonly known as: VIBRA -TABS Take 1 tablet twice a day with food x 7 days   estradiol 0.1 MG/GM vaginal cream Commonly known as: ESTRACE Place vaginally.   ferrous sulfate 325 (65 FE) MG tablet Take 325 mg by mouth 2 (two) times daily.   gabapentin 100 MG capsule Commonly known as: NEURONTIN Take 100 mg by mouth 3 (three) times daily.   levothyroxine 112 MCG tablet Commonly known as: SYNTHROID Take 112 mcg by mouth daily.   Multi-Vitamin tablet Take 1 tablet by mouth daily.   mupirocin  ointment 2 % Commonly known as: BACTROBAN  Apply to skin qd-bid   NEOMYCIN -POLYMYXIN-HYDROCORTISONE 1 % Soln OTIC solution Commonly known as: CORTISPORIN Apply 1-2 drops to toe BID after soaking   omeprazole 40 MG capsule Commonly known as: PRILOSEC Take 40 mg by mouth daily.   predniSONE 10 MG tablet Commonly known as: DELTASONE Take by mouth.   Stelara  45 MG/0.5ML injection Generic drug: ustekinumab  Inject into the skin.        Allergies:  Allergies  Allergen Reactions   Codeine Itching and Nausea Only   Hydromorphone Other (See Comments) and Shortness Of Breath   Infliximab  Anaphylaxis and Other (See Comments)   Mesalamine Nausea  Only    Other reaction(s): Abdominal Pain   Sulfa Antibiotics Itching   Oxycodone Nausea And Vomiting   Vedolizumab Other (See Comments)    Developed antibodies; had UC flare   Nisoldipine Rash    Family History: Family History  Problem Relation Age of Onset   Colon cancer Mother        dx in her 40s   Kidney cancer Mother 56       slow growing   Cancer Father        squamous cell cancer in the mouth; smoker and Journalist, [employer] - syphoned gasoline   Bladder Cancer Brother 73       smoker for 40 years   Breast cancer Maternal Aunt 49   COPD Maternal Uncle    Dementia Maternal Grandmother    Heart disease Maternal Grandfather     Pancreatic cancer Cousin 54       smoker   Breast cancer Paternal Aunt        dx >65 years of age    Social History:  reports that she quit smoking about 44 years ago. Her smoking use included cigarettes. She started smoking about 56 years ago. She has a 6.2 pack-year smoking history. She does not have any smokeless tobacco history on file. She reports current alcohol use. No history on file for drug use.  ROS: Pertinent ROS in HPI  Physical Exam: There were no vitals taken for this visit.  Constitutional:  Well nourished. Alert and oriented, No acute distress. HEENT: [place] AT, moist mucus membranes.  Trachea midline, no masses. Cardiovascular: No clubbing, cyanosis, or edema. Respiratory: Normal respiratory effort, no increased work of breathing. GU: No CVA tenderness.  No bladder fullness or masses.  Recession of labia minora, dry, pale vulvar vaginal mucosa and loss of mucosal ridges and folds.  Normal urethral meatus, no lesions, no prolapse, no discharge.   No urethral masses, tenderness and/or tenderness. No bladder fullness, tenderness or masses. *** vagina mucosa, *** estrogen effect, no discharge, no lesions, *** pelvic support, *** cystocele and *** rectocele noted.  No cervical motion tenderness.  Uterus is freely mobile and non-fixed.  No adnexal/parametria masses or tenderness noted.  Anus and perineum are without rashes or lesions.   ***  Neurologic: Grossly intact, no focal deficits, moving all 4 extremities. Psychiatric: Normal mood and affect.    Laboratory Data: See EPIC and HPI I have reviewed the labs.   Pertinent Imaging: N/A  Assessment & Plan:    1. Renal mass -Status post partial nephrectomy (2022) -RUS pending   2. GSM - continue vaginal estrogen cream three nights weekly   No follow-ups on file.  These notes generated with voice recognition software. I apologize for typographical errors.  [name]  [facility] [address]  [address] [place], [place] [address] [phone]

## 2024-07-25 ENCOUNTER — Encounter: Payer: Self-pay | Admitting: Urology

## 2024-07-25 ENCOUNTER — Ambulatory Visit (INDEPENDENT_AMBULATORY_CARE_PROVIDER_SITE_OTHER): Admitting: Urology

## 2024-07-25 VITALS — BP 124/68 | HR 68 | Temp 98.7°F | Resp 17 | Ht 66.0 in | Wt 156.0 lb

## 2024-07-25 DIAGNOSIS — R300 Dysuria: Secondary | ICD-10-CM | POA: Diagnosis not present

## 2024-07-25 DIAGNOSIS — N2889 Other specified disorders of kidney and ureter: Secondary | ICD-10-CM | POA: Diagnosis not present

## 2024-07-25 DIAGNOSIS — N952 Postmenopausal atrophic vaginitis: Secondary | ICD-10-CM | POA: Diagnosis not present

## 2024-07-25 LAB — URINALYSIS, COMPLETE
Bilirubin, UA: NEGATIVE
Glucose, UA: NEGATIVE
Ketones, UA: NEGATIVE
Leukocytes,UA: NEGATIVE
Nitrite, UA: NEGATIVE
Protein,UA: NEGATIVE
RBC, UA: NEGATIVE
Specific Gravity, UA: <1.005 — ABNORMAL LOW (ref 1.005–1.030)
Urobilinogen, Ur: 0.2 mg/dL (ref 0.2–1.0)
pH, UA: 6 (ref 5.0–7.5)

## 2024-07-25 LAB — MICROSCOPIC EXAMINATION: Bacteria, UA: NONE SEEN

## 2024-07-25 NOTE — Patient Instructions (Signed)
 We have ordered RUSand the scheduling department should reach out to you to schedule this appointment.  Sometimes, they have difficulty reaching patients because they are calling from an unknown number and many people have their phones set to ignore unknown numbers.  They do try to leave messages, but sometimes voicemails are not set up or mailboxes are full.  If you have not heard from the scheduling department in a week, please call [phone] to schedule your RUS

## 2024-07-27 DIAGNOSIS — K219 Gastro-esophageal reflux disease without esophagitis: Secondary | ICD-10-CM | POA: Diagnosis not present

## 2024-07-27 DIAGNOSIS — Z860101 Personal history of adenomatous and serrated colon polyps: Secondary | ICD-10-CM | POA: Diagnosis not present

## 2024-07-27 DIAGNOSIS — Z79899 Other long term (current) drug therapy: Secondary | ICD-10-CM | POA: Diagnosis not present

## 2024-07-27 DIAGNOSIS — K580 Irritable bowel syndrome with diarrhea: Secondary | ICD-10-CM | POA: Diagnosis not present

## 2024-07-27 DIAGNOSIS — K51018 Ulcerative (chronic) pancolitis with other complication: Secondary | ICD-10-CM | POA: Diagnosis not present

## 2024-08-07 ENCOUNTER — Ambulatory Visit
Admission: RE | Admit: 2024-08-07 | Discharge: 2024-08-07 | Disposition: A | Discharge status: Home / Self Care | Source: Ambulatory Visit | Attending: Urology

## 2024-08-07 DIAGNOSIS — N2889 Other specified disorders of kidney and ureter: Secondary | ICD-10-CM | POA: Diagnosis not present

## 2024-08-07 DIAGNOSIS — N281 Cyst of kidney, acquired: Secondary | ICD-10-CM | POA: Diagnosis not present

## 2024-08-16 ENCOUNTER — Ambulatory Visit: Payer: Self-pay | Admitting: Physician Assistant

## 2024-08-16 NOTE — Telephone Encounter (Signed)
 Patient called office back regarding additional imaging. She is requesting for the MRI referral be faxed to [facility] at [phone]. Patient stated that she is requesting this because she is already established and very familiar with them. She would also like a call back letting her know referral was sent, so she can schedule MRI as soon as possible. Please advise.

## 2024-08-17 ENCOUNTER — Other Ambulatory Visit: Payer: Self-pay | Admitting: Urology

## 2024-08-17 DIAGNOSIS — N2889 Other specified disorders of kidney and ureter: Secondary | ICD-10-CM

## 2024-08-17 DIAGNOSIS — E039 Hypothyroidism, unspecified: Secondary | ICD-10-CM | POA: Diagnosis not present

## 2024-08-19 DIAGNOSIS — Z1231 Encounter for screening mammogram for malignant neoplasm of breast: Secondary | ICD-10-CM | POA: Diagnosis not present

## 2024-08-19 DIAGNOSIS — R92323 Mammographic fibroglandular density, bilateral breasts: Secondary | ICD-10-CM | POA: Diagnosis not present

## 2024-08-22 DIAGNOSIS — I341 Nonrheumatic mitral (valve) prolapse: Secondary | ICD-10-CM | POA: Diagnosis not present

## 2024-08-23 DIAGNOSIS — N2889 Other specified disorders of kidney and ureter: Secondary | ICD-10-CM | POA: Diagnosis not present

## 2024-08-28 DIAGNOSIS — K5100 Ulcerative (chronic) pancolitis without complications: Secondary | ICD-10-CM | POA: Diagnosis not present

## 2024-08-31 DIAGNOSIS — Z905 Acquired absence of kidney: Secondary | ICD-10-CM | POA: Diagnosis not present

## 2024-08-31 DIAGNOSIS — Q6100 Congenital renal cyst, unspecified: Secondary | ICD-10-CM | POA: Diagnosis not present

## 2024-08-31 DIAGNOSIS — I059 Rheumatic mitral valve disease, unspecified: Secondary | ICD-10-CM | POA: Diagnosis not present

## 2024-09-25 DIAGNOSIS — N289 Disorder of kidney and ureter, unspecified: Secondary | ICD-10-CM | POA: Diagnosis not present

## 2024-09-25 DIAGNOSIS — D3001 Benign neoplasm of right kidney: Secondary | ICD-10-CM | POA: Diagnosis not present

## 2024-09-25 DIAGNOSIS — N2889 Other specified disorders of kidney and ureter: Secondary | ICD-10-CM | POA: Diagnosis not present

## 2024-12-06 ENCOUNTER — Other Ambulatory Visit: Payer: Self-pay | Admitting: Family Medicine

## 2024-12-06 DIAGNOSIS — Z780 Asymptomatic menopausal state: Secondary | ICD-10-CM

## 2024-12-11 ENCOUNTER — Ambulatory Visit: Admitting: Dermatology

## 2024-12-28 ENCOUNTER — Ambulatory Visit
Admission: RE | Admit: 2024-12-28 | Discharge: 2024-12-28 | Disposition: A | Discharge status: Home / Self Care | Source: Ambulatory Visit | Attending: Family Medicine | Admitting: Family Medicine

## 2024-12-28 DIAGNOSIS — Z780 Asymptomatic menopausal state: Secondary | ICD-10-CM | POA: Diagnosis present
